# Patient Record
Sex: Female | Born: 1960 | Race: Black or African American | Hispanic: No | Marital: Married | Smoking: Never smoker
Health system: Southern US, Community
[De-identification: ages and names within clinical notes are randomized; demographics above are authoritative.]

## PROBLEM LIST (undated history)

## (undated) DIAGNOSIS — T7840XA Allergy, unspecified, initial encounter: Secondary | ICD-10-CM

## (undated) DIAGNOSIS — G5603 Carpal tunnel syndrome, bilateral upper limbs: Secondary | ICD-10-CM

## (undated) DIAGNOSIS — D573 Sickle-cell trait: Secondary | ICD-10-CM

## (undated) DIAGNOSIS — D571 Sickle-cell disease without crisis: Secondary | ICD-10-CM

## (undated) DIAGNOSIS — N390 Urinary tract infection, site not specified: Secondary | ICD-10-CM

## (undated) DIAGNOSIS — D219 Benign neoplasm of connective and other soft tissue, unspecified: Secondary | ICD-10-CM

## (undated) DIAGNOSIS — J45909 Unspecified asthma, uncomplicated: Secondary | ICD-10-CM

## (undated) DIAGNOSIS — Z8619 Personal history of other infectious and parasitic diseases: Secondary | ICD-10-CM

## (undated) DIAGNOSIS — D069 Carcinoma in situ of cervix, unspecified: Secondary | ICD-10-CM

## (undated) DIAGNOSIS — R102 Pelvic and perineal pain: Secondary | ICD-10-CM

## (undated) DIAGNOSIS — N8300 Follicular cyst of ovary, unspecified side: Secondary | ICD-10-CM

## (undated) HISTORY — PX: MYOMECTOMY: SHX85

## (undated) HISTORY — DX: Allergy, unspecified, initial encounter: T78.40XA

## (undated) HISTORY — DX: Follicular cyst of ovary, unspecified side: N83.00

## (undated) HISTORY — DX: Sickle-cell disease without crisis: D57.1

## (undated) HISTORY — DX: Benign neoplasm of connective and other soft tissue, unspecified: D21.9

## (undated) HISTORY — DX: Personal history of other infectious and parasitic diseases: Z86.19

## (undated) HISTORY — DX: Sickle-cell trait: D57.3

## (undated) HISTORY — PX: COLONOSCOPY: SHX174

## (undated) HISTORY — PX: TUBAL LIGATION: SHX77

## (undated) HISTORY — DX: Carcinoma in situ of cervix, unspecified: D06.9

## (undated) HISTORY — DX: Pelvic and perineal pain: R10.2

## (undated) HISTORY — DX: Unspecified asthma, uncomplicated: J45.909

## (undated) HISTORY — DX: Carpal tunnel syndrome, bilateral upper limbs: G56.03

## (undated) HISTORY — DX: Urinary tract infection, site not specified: N39.0

---

## 1996-09-07 HISTORY — PX: ABDOMINAL HYSTERECTOMY: SHX81

## 1999-09-08 HISTORY — PX: CARPAL TUNNEL RELEASE: SHX101

## 2006-01-11 ENCOUNTER — Other Ambulatory Visit: Admission: RE | Admit: 2006-01-11 | Discharge: 2006-01-11 | Payer: Self-pay | Admitting: Obstetrics and Gynecology

## 2006-06-21 ENCOUNTER — Emergency Department (HOSPITAL_COMMUNITY): Admission: EM | Admit: 2006-06-21 | Discharge: 2006-06-21 | Payer: Self-pay | Admitting: Emergency Medicine

## 2006-11-21 ENCOUNTER — Emergency Department (HOSPITAL_COMMUNITY): Admission: EM | Admit: 2006-11-21 | Discharge: 2006-11-22 | Payer: Self-pay | Admitting: *Deleted
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
## 2007-10-16 IMAGING — CT CT ABDOMEN W/O CM
1 of 2 series · 15 of 32 positions shown, 19 images · IV contrast (agent unspecified)
Comparison: None.

CLINICAL DATA: 45-year-old with hematuria.
 ABDOMEN CT WITHOUT CONTRAST:
TECHNIQUE: Multidetector CT imaging of the abdomen was performed following the standard protocol without IV contrast.
TECHNIQUE: Multidetector CT imaging of the pelvis was performed following the standard protocol without IV contrast.

[Series 2: abd/pelv w/o 5.0 b31f st · axial · non-contrast · 0.67mm/px · z∈[-395,-30]mm · 15 of 81 slices shown, 19 images]
[im 4/81  soft-tissue]
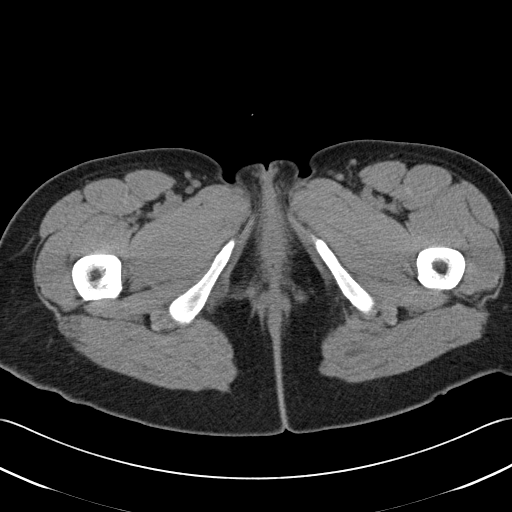
[im 4/81  bone]
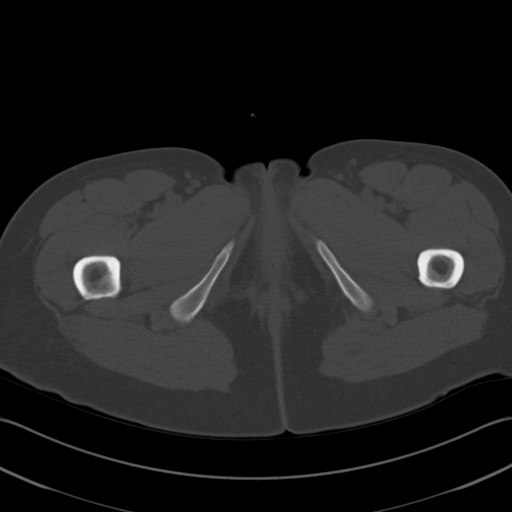
[im 10/81  soft-tissue]
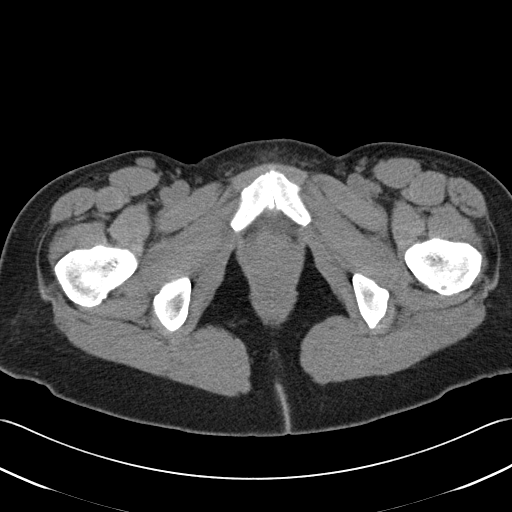
[im 17/81  soft-tissue]
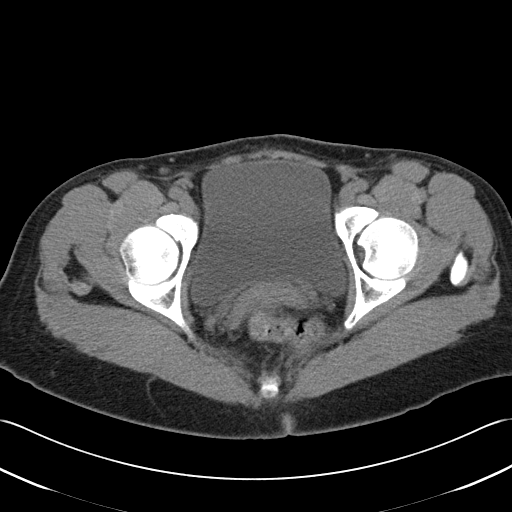
[im 23/81  soft-tissue]
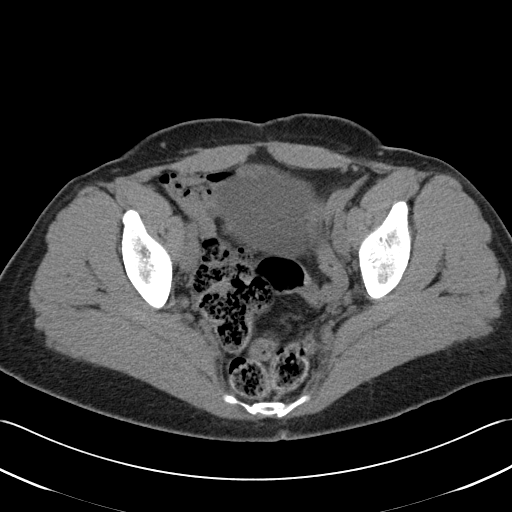
[im 29/81  soft-tissue]
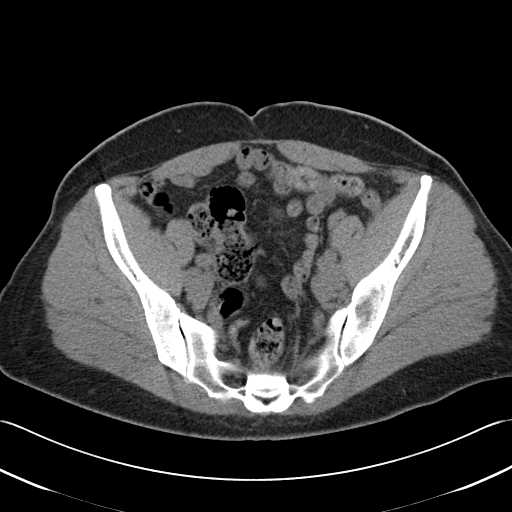
[im 36/81  soft-tissue]
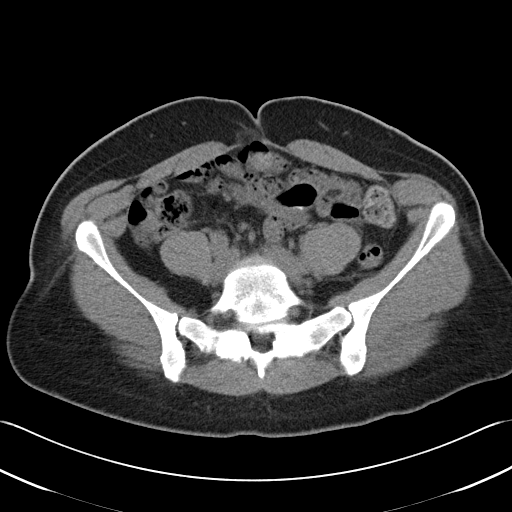
[im 42/81  soft-tissue]
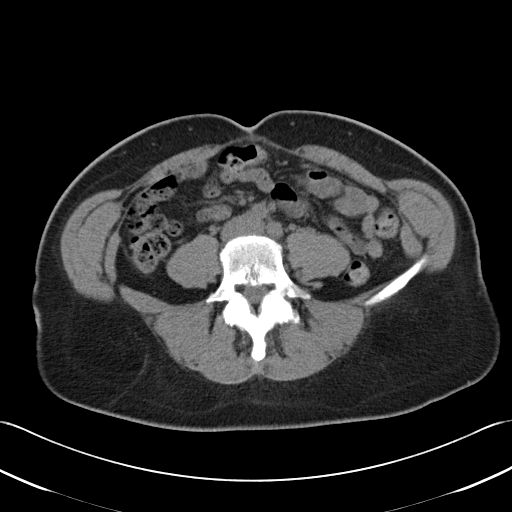
[im 45/81  soft-tissue]
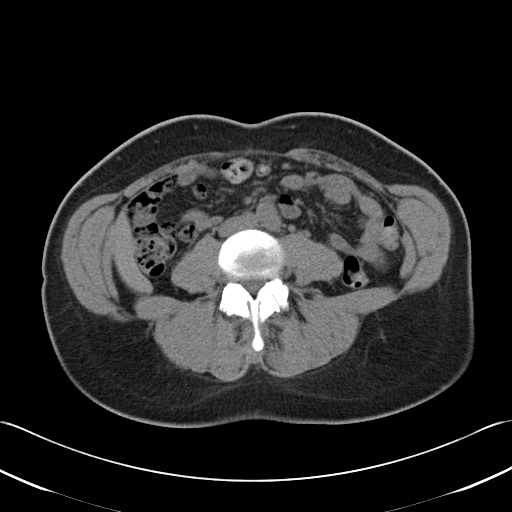
[im 52/81  soft-tissue]
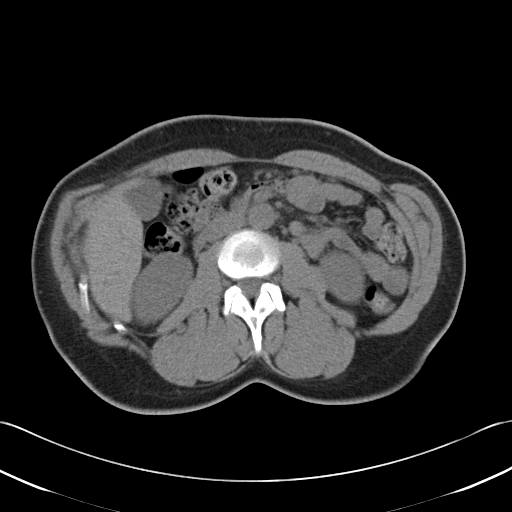
[im 52/81  bone]
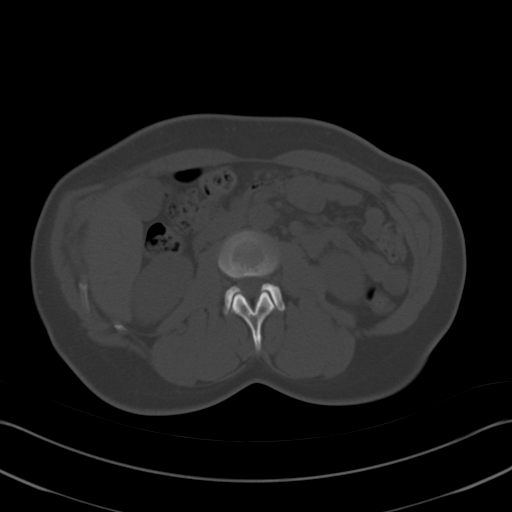
[im 58/81  soft-tissue]
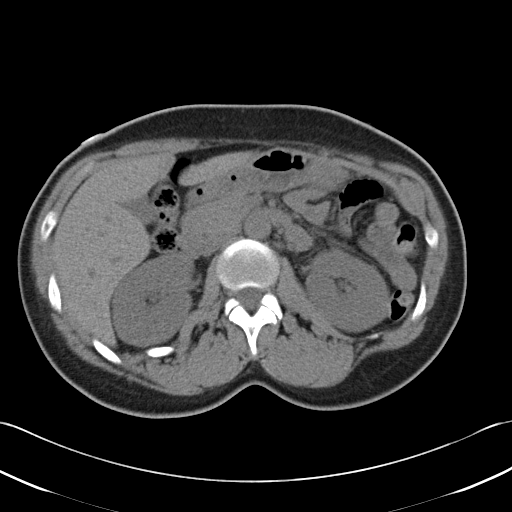
[im 65/81  soft-tissue]
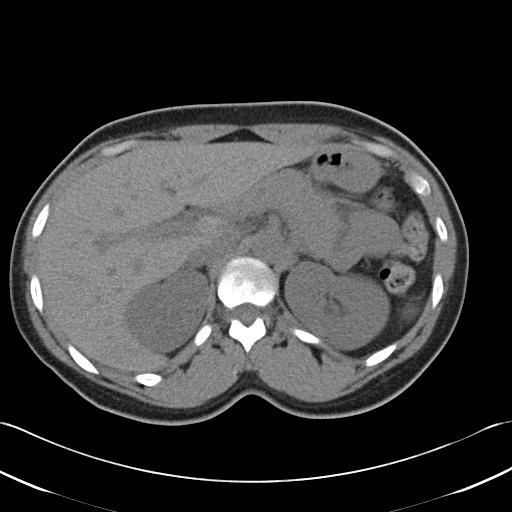
[im 68/81  lung]
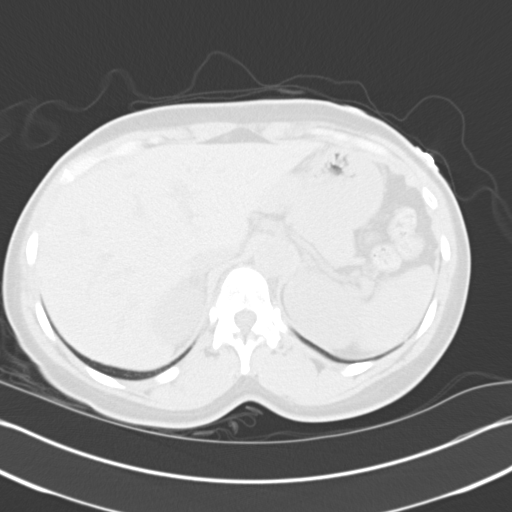
[im 71/81  soft-tissue]
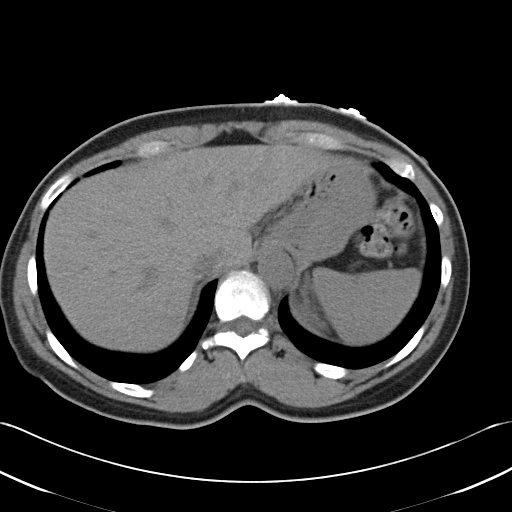
[im 71/81  lung]
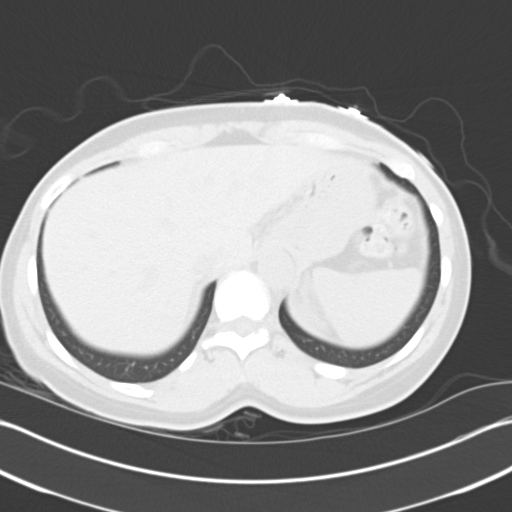
[im 74/81  lung]
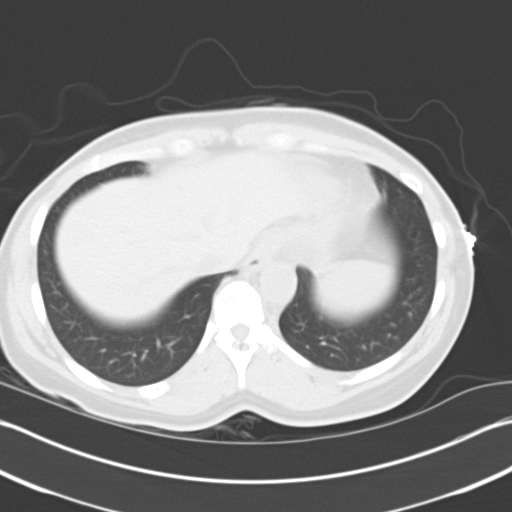
[im 77/81  soft-tissue]
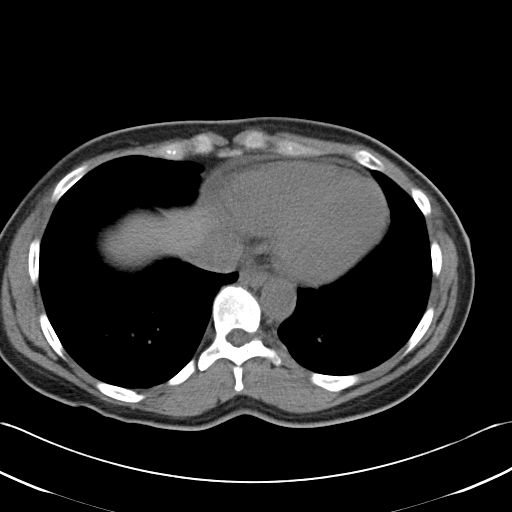
[im 77/81  lung]
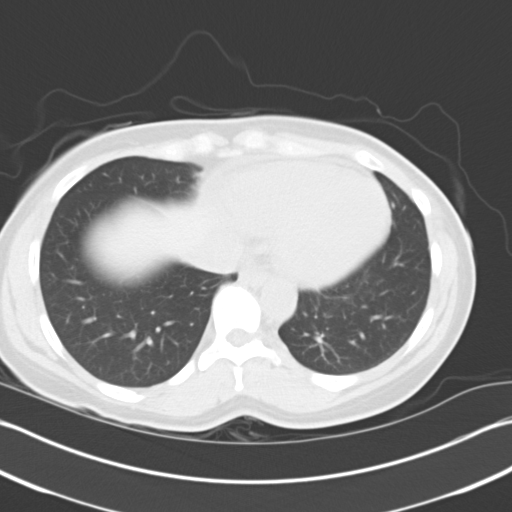

[15 of 32 positions shown; findings below may reference images not displayed]

FINDINGS: The lung bases are clear.  A small amount of pericardial fluid is noted.  The distal esophagus is grossly normal.
 The unenhanced appearance of the liver and spleen are unremarkable.   The spleen is normal in size.  The pancreas, adrenal glands, and kidneys demonstrate no significant findings.  No renal calculi.   No evidence for renal obstructive process. 
 The gallbladder appears normal.   The stomach, duodenum, small bowel, and colon are grossly normal, but the study is limited without oral contrast. 
 The aorta is normal in caliber.   No mesenteric or retroperitoneal masses or adenopathy.   
 No significant bony findings.
IMPRESSION: Unremarkable CT examination of the abdomen. 
 PELVIS CT WITHOUT CONTRAST:
FINDINGS: The rectum, sigmoid colon, and visualized small bowel loops are grossly normal.   No ureteral or bladder calculi.   Phleboliths or calcifications are noted in the pelvis.  The patient has had a hysterectomy.   The ovaries are still present and appear normal.   Mild degenerative changes involving the pubic symphysis.  Facet disease is noted in the lower lumbar spine.
IMPRESSION: 1.  No acute pelvic findings, pelvic masses, or adenopathy. 
 2.  No ureteral or bladder calculi.
 3.  Status post hysterectomy.   Both ovaries are still present and appear normal.

## 2009-11-20 DIAGNOSIS — G5600 Carpal tunnel syndrome, unspecified upper limb: Secondary | ICD-10-CM | POA: Insufficient documentation

## 2011-10-24 DIAGNOSIS — Z9889 Other specified postprocedural states: Secondary | ICD-10-CM

## 2011-10-24 DIAGNOSIS — K635 Polyp of colon: Secondary | ICD-10-CM | POA: Insufficient documentation

## 2011-10-24 DIAGNOSIS — Z90710 Acquired absence of both cervix and uterus: Secondary | ICD-10-CM

## 2011-10-24 DIAGNOSIS — D219 Benign neoplasm of connective and other soft tissue, unspecified: Secondary | ICD-10-CM

## 2011-10-24 DIAGNOSIS — G5600 Carpal tunnel syndrome, unspecified upper limb: Secondary | ICD-10-CM

## 2011-10-24 DIAGNOSIS — R102 Pelvic and perineal pain: Secondary | ICD-10-CM

## 2011-10-24 DIAGNOSIS — N390 Urinary tract infection, site not specified: Secondary | ICD-10-CM

## 2011-10-24 DIAGNOSIS — D069 Carcinoma in situ of cervix, unspecified: Secondary | ICD-10-CM

## 2011-12-09 ENCOUNTER — Ambulatory Visit (INDEPENDENT_AMBULATORY_CARE_PROVIDER_SITE_OTHER): Payer: PRIVATE HEALTH INSURANCE | Admitting: Obstetrics and Gynecology

## 2011-12-09 DIAGNOSIS — Z01419 Encounter for gynecological examination (general) (routine) without abnormal findings: Secondary | ICD-10-CM

## 2012-04-21 ENCOUNTER — Ambulatory Visit (INDEPENDENT_AMBULATORY_CARE_PROVIDER_SITE_OTHER): Payer: PRIVATE HEALTH INSURANCE | Admitting: Family Medicine

## 2012-04-21 VITALS — BP 119/82 | HR 88 | Temp 98.2°F | Resp 18 | Ht 64.0 in | Wt 172.0 lb

## 2012-04-21 DIAGNOSIS — M549 Dorsalgia, unspecified: Secondary | ICD-10-CM

## 2012-04-21 DIAGNOSIS — T148XXA Other injury of unspecified body region, initial encounter: Secondary | ICD-10-CM

## 2012-04-21 MED ORDER — LIDOCAINE 5 % EX PTCH: 1.0000 | MEDICATED_PATCH | CUTANEOUS | Status: AC

## 2012-04-21 NOTE — Progress Notes (Signed)
[facility]:  Office Visit  Chief Complaint:  Chief Complaint  Patient presents with  . Neck Pain    right side since lastnight  . Headache    right side frontal   . Shoulder Pain    right shoulder  . Back Pain    upper back    HPI: Jenna Cox is a 51 y.o. female who complains of  Right sided neck pain, associated with HA behind her right orbit, radiating to shoulder and back of scapula; throbbing, intermittent. Has not taken anything for it. No weakness, numbness or tingling. 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 She has a h/o of carpal tunnels/p repair with residual deficits in the right hand and wrist. She now has carpal tunnel she thinks in the left hand as well.  Denies any new weakness, numbness or tingling.  Gaylyn Rong is not asscoicated with vision changes, photo or phonophobia, neck stiffness ; denies meningeal signs/ sxs   Past Medical History  Diagnosis Date  . Carpal tunnel syndrome of left wrist   . Severe dysplasia of cervix   . Pelvic pain   . Follicle cyst   . Recurrent UTI (urinary tract infection)   . Asthma   . Fibroids   . History of chicken pox   . H/O mumps   . H/O measles   . Sickle cell trait    Past Surgical History  Procedure Date  . Abdominal hysterectomy 1998  . Cesarean section   . Myomectomy   . Tubal ligation   . Carpal tunnel release 2001   History   Social History  . Marital Status: Married    Spouse Name: N/A    Number of Children: N/A  . Years of Education: N/A   Social History Main Topics  . Smoking status: Never Smoker   . Smokeless tobacco: None  . Alcohol Use: No  . Drug Use: None  . Sexually Active: None   Other Topics Concern  . None   Social History Narrative  . None   Family History  Problem Relation Age of Onset  . Cancer Paternal Aunt    Allergies  Allergen Reactions  . Other   . Sulfa Antibiotics    Prior to Admission  medications   Medication Sig Start Date End Date Taking? Authorizing Provider  doxycycline (VIBRAMYCIN) 50 MG capsule Take 50 mg by mouth 2 (two) times daily.   Yes Historical Provider, MD  HYDROcodone-acetaminophen (NORCO) 7.5-325 MG per tablet Take 1 tablet by mouth every 6 (six) hours as needed.   Yes Historical Provider, MD  Multiple Vitamin (MULTIVITAMIN) tablet Take 1 tablet by mouth daily.   Yes Historical Provider, MD     ROS: The patient denies fevers, chills, night sweats, unintentional weight loss, chest pain, palpitations, wheezing, dyspnea on exertion, nausea, vomiting, abdominal pain, dysuria, hematuria, melena, + right wrsit and hand, chronic issues--numbness, weakness, or tingling.   All other systems have been reviewed and were otherwise negative with the exception of those mentioned in the HPI and as above.    PHYSICAL EXAM: Filed Vitals:   04/21/12 1437  BP: 119/82  Pulse: 88  Temp: 98.2 F (36.8 C)  Resp: 18   Filed Vitals:   04/21/12 1437  Height: 5\' 4"  (1.626 m)  Weight: 172 lb (78.019 kg)   Body mass index is 29.52 kg/(m^2).  General: Alert, no acute distress HEENT:  Normocephalic, atraumatic, oropharynx patent.  Cardiovascular:  Regular rate and rhythm, no rubs murmurs or gallops.  No Carotid bruits, radial pulse intact. No pedal edema.  Respiratory: Clear to auscultation bilaterally.  No wheezes, rales, or rhonchi.  No cyanosis, no use of accessory musculature GI: No organomegaly, abdomen is soft and non-tender, positive bowel sounds.  No masses. Skin: No rashes. Neurologic: Facial musculature symmetric. Psychiatric: Patient is appropriate throughout our interaction. Lymphatic: No cervical lymphadenopathy Musculoskeletal: Gait intact. ROM intact Tender at trapezius and rhomoboid bilaterally with palpation 2/2 DTR UE bilaterally Right hand 4/5 strength which is chronic since carpal tunnel surgery Sensation intact    LABS: No results found for this  or any previous visit.   EKG/XRAY:   Primary read interpreted by Dr. Conley Rolls at [facility].   ASSESSMENT/PLAN: Encounter Diagnoses  Name Primary?  . Sprain and strain Yes  . Back pain    Upper back strain secondary to poor posture and hunched position and exacerbation of acute on chronic issues with carpal tunnel due to repetitive motion with computer work Defer Xray  Defer Trigger point injection Would like to try Lidoderm patch and Tylenol prn ( has allergies to ASA/NSAID)    Raiana Pharris PHUONG, DO 04/21/2012 4:19 PM

## 2012-10-08 ENCOUNTER — Ambulatory Visit (INDEPENDENT_AMBULATORY_CARE_PROVIDER_SITE_OTHER): Payer: PRIVATE HEALTH INSURANCE | Admitting: Family Medicine

## 2012-10-08 VITALS — BP 155/84 | HR 83 | Temp 98.4°F | Resp 17 | Ht 64.5 in | Wt 173.0 lb

## 2012-10-08 DIAGNOSIS — Z13220 Encounter for screening for lipoid disorders: Secondary | ICD-10-CM

## 2012-10-08 DIAGNOSIS — R5383 Other fatigue: Secondary | ICD-10-CM

## 2012-10-08 DIAGNOSIS — Z8613 Personal history of malaria: Secondary | ICD-10-CM

## 2012-10-08 DIAGNOSIS — Z0000 Encounter for general adult medical examination without abnormal findings: Secondary | ICD-10-CM

## 2012-10-08 DIAGNOSIS — E559 Vitamin D deficiency, unspecified: Secondary | ICD-10-CM

## 2012-10-08 DIAGNOSIS — R5381 Other malaise: Secondary | ICD-10-CM

## 2012-10-08 LAB — POCT CBC
Granulocyte percent: 64 %G (ref 37–80)
HCT, POC: 38.8 % (ref 37.7–47.9)
Hemoglobin: 12 g/dL — AB (ref 12.2–16.2)
Lymph, poc: 2.4 (ref 0.6–3.4)
MCH, POC: 27.4 pg (ref 27–31.2)
MCHC: 30.9 g/dL — AB (ref 31.8–35.4)
MCV: 88.6 fL (ref 80–97)
MID (cbc): 0.4 (ref 0–0.9)
MPV: 10.1 fL (ref 0–99.8)
POC Granulocyte: 5 (ref 2–6.9)
POC LYMPH PERCENT: 30.6 %L (ref 10–50)
POC MID %: 5.4 % (ref 0–12)
Platelet Count, POC: 295 10*3/uL (ref 142–424)
RBC: 4.38 M/uL (ref 4.04–5.48)
RDW, POC: 14.8 %
WBC: 7.8 10*3/uL (ref 4.6–10.2)

## 2012-10-08 LAB — LIPID PANEL: Triglycerides: 62 mg/dL (ref 40–160)

## 2012-10-08 LAB — BASIC METABOLIC PANEL: Creatinine: 0.8 mg/dL (ref 0.5–1.1)

## 2012-10-08 LAB — TSH: TSH: 0.49 u[IU]/mL (ref 0.41–5.90)

## 2012-10-08 MED ORDER — MEFLOQUINE HCL 250 MG PO TABS: 250.0000 mg | ORAL_TABLET | ORAL | Status: DC

## 2012-10-08 NOTE — Progress Notes (Signed)
[facility]:  Office Visit  Chief Complaint:  Chief Complaint  Patient presents with  . Annual Exam    HPI: Jenna Cox is a 52 y.o. female who is here for  Annual exam without pap. Last pap was at [facility] in [place] in July 2013. Normal pap. Had abnormal paps in past, she also had a partial hysterectomy. Normal pap since hysterectomy in 1999. Getting OB[facility]GY[facility] care at [facility] and also here in [place]. Mammograms have been normal and are UTD, next one in July. She has a colonoscopy in 2012 in [place]-had polyps but does not know if she needs to return  in 5 years /10 years.  Does not want flu vaccine. UTD on TDap.   No complaints except her numbness/tingling from her carpal tunnel and neck issues , has a h/o c-5 disc herniation, carpal tunnel s/p carpal tunnel release. 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 She also has had more numbness and tingling because also has been driving her husband's shift more since she let her son borrow her car.   She would also like malaria ppx due to going to [place] next week for [employer].   Past Medical History  Diagnosis Date  . Carpal tunnel syndrome of left wrist   . Severe dysplasia of cervix   . Pelvic pain   . Follicle cyst   . Recurrent UTI (urinary tract infection)   . Asthma   . Fibroids   . History of chicken pox   . H/O mumps   . H/O measles   . Sickle cell trait    Past Surgical History  Procedure Date  . Abdominal hysterectomy 1998  . Cesarean section   . Myomectomy   . Tubal ligation   . Carpal tunnel release 2001   History   Social History  . Marital Status: Married    Spouse Name: N/A    Number of Children: N/A  . Years of Education: N/A   Social History Main Topics  . Smoking status: Never Smoker   . Smokeless tobacco:  None  . Alcohol Use: No  . Drug Use: No  . Sexually Active: Yes    Birth Control/ Protection: None   Other Topics Concern  . None   Social History Narrative  . None   Family History  Problem Relation Age of Onset  . Cancer Paternal Aunt    Allergies  Allergen Reactions  . Other   . Sulfa Antibiotics    Prior to Admission medications   Medication Sig Start Date End Date Taking? Authorizing Provider  Multiple Vitamin (MULTIVITAMIN) tablet Take 1 tablet by mouth daily.   Yes Historical Provider, MD  doxycycline (VIBRAMYCIN) 50 MG capsule Take 50 mg by mouth 2 (two) times daily.    Historical Provider, MD  HYDROcodone-acetaminophen (NORCO) 7.5-325 MG per tablet Take 1 tablet by mouth every 6 (six) hours as needed.    Historical Provider, MD     ROS: The patient denies fevers, chills, night sweats, unintentional weight loss, chest pain, palpitations, wheezing, dyspnea on exertion, nausea, vomiting, abdominal pain, dysuria, hematuria, melena All other systems have been reviewed and were otherwise negative with the exception of those mentioned in the HPI and as above.    PHYSICAL EXAM: Filed Vitals:   10/08/12  1142  BP: 155/84  Pulse: 83  Temp: 98.4 F (36.9 C)  Resp: 17   Filed Vitals:   10/08/12 1142  Height: 5' 4.5" (1.638 m)  Weight: 173 lb (78.472 kg)   Body mass index is 29.24 kg/(m^2).  General: Alert, no acute distress HEENT:  Normocephalic, atraumatic, oropharynx patent. EOMI, PERRLA, fundoscopic exam nl Cardiovascular:  Regular rate and rhythm, no rubs murmurs or gallops.  No Carotid bruits, radial pulse intact. No pedal edema.  Respiratory: Clear to auscultation bilaterally.  No wheezes, rales, or rhonchi.  No cyanosis, no use of accessory musculature GI: No organomegaly, abdomen is soft and non-tender, positive bowel sounds.  No masses. Skin: No rashes. Neurologic: Facial musculature symmetric. Psychiatric: Patient is appropriate throughout our  interaction. Lymphatic: No cervical lymphadenopathy Musculoskeletal: Gait intact.   LABS: Results for orders placed in visit on 10/08/12  POCT CBC      Component Value Range   WBC 7.8  4.6 - 10.2 K/uL   Lymph, poc 2.4  0.6 - 3.4   POC LYMPH PERCENT 30.6  10 - 50 %L   MID (cbc) 0.4  0 - 0.9   POC MID % 5.4  0 - 12 %M   POC Granulocyte 5.0  2 - 6.9   Granulocyte percent 64.0  37 - 80 %G   RBC 4.38  4.04 - 5.48 M/uL   Hemoglobin 12.0 (*) 12.2 - 16.2 g/dL   HCT, POC 16.1  09.6 - 47.9 %   MCV 88.6  80 - 97 fL   MCH, POC 27.4  27 - 31.2 pg   MCHC 30.9 (*) 31.8 - 35.4 g/dL   RDW, POC 04.5     Platelet Count, POC 295  142 - 424 K/uL   MPV 10.1  0 - 99.8 fL     EKG/XRAY:   Primary read interpreted by Dr. Conley Rolls at [facility].   ASSESSMENT/PLAN: Encounter Diagnoses  Name Primary?  . Vitamin D deficiency Yes  . PE (physical exam), annual   . Fatigue   . Screening, lipid   . History of malaria    Labs pending for annual exam Advise to call GI office and see when should return to office for repeat colonoscopy Rx Mefloquine ppx for 1 week visit to [place] for funeral Wrist brace for carpal tunnel to use at night and at work, she already has them F/u prn   Lainie Daubert PHUONG, DO 10/08/2012 2:49 PM

## 2012-10-27 ENCOUNTER — Telehealth: Payer: Self-pay

## 2012-10-27 ENCOUNTER — Other Ambulatory Visit: Payer: Self-pay | Admitting: Family Medicine

## 2012-10-27 DIAGNOSIS — E559 Vitamin D deficiency, unspecified: Secondary | ICD-10-CM

## 2012-10-27 MED ORDER — ERGOCALCIFEROL 1.25 MG (50000 UT) PO CAPS: 50000.0000 [IU] | ORAL_CAPSULE | ORAL | Status: DC

## 2012-10-27 NOTE — Telephone Encounter (Signed)
Can you check on this?

## 2012-10-27 NOTE — Telephone Encounter (Signed)
Patient calling in regards to lab results that she had done on 10/08/12 with dr. Conley Rolls when she had her cpe. Labs appear canceled? Can someone look into this and let her know if she needs this redone or if the labs are ready with results. Mirian Mo you! Best number is: [phone]

## 2012-10-27 NOTE — Telephone Encounter (Signed)
Pt's labs were sent to [facility]. Gave the copy to Dr. Conley Rolls for review. Per Dr. Conley Rolls, let pt know that her Vit D is still low at 22 and that we will send in Vit D 50,000 for 12 weeks then she is to RTC for a recheck. Also notified her that her LDL was 130 and to work on diet and exercise. All of the rest of her labs were normal. Labs sent to MR to be scanned.

## 2012-10-29 ENCOUNTER — Encounter: Payer: Self-pay | Admitting: Family Medicine

## 2012-11-26 ENCOUNTER — Encounter: Payer: Self-pay | Admitting: *Deleted

## 2012-12-29 ENCOUNTER — Ambulatory Visit (INDEPENDENT_AMBULATORY_CARE_PROVIDER_SITE_OTHER): Payer: PRIVATE HEALTH INSURANCE | Admitting: Physician Assistant

## 2012-12-29 ENCOUNTER — Encounter: Payer: Self-pay | Admitting: Physician Assistant

## 2012-12-29 VITALS — BP 131/76 | HR 89 | Temp 98.5°F | Resp 16 | Ht 64.0 in | Wt 173.0 lb

## 2012-12-29 DIAGNOSIS — J069 Acute upper respiratory infection, unspecified: Secondary | ICD-10-CM

## 2012-12-29 DIAGNOSIS — J029 Acute pharyngitis, unspecified: Secondary | ICD-10-CM

## 2012-12-29 DIAGNOSIS — R509 Fever, unspecified: Secondary | ICD-10-CM

## 2012-12-29 LAB — POCT INFLUENZA A/B: Influenza B, POC: NEGATIVE

## 2012-12-29 LAB — POCT RAPID STREP A (OFFICE): Rapid Strep A Screen: NEGATIVE

## 2012-12-29 MED ORDER — ALBUTEROL SULFATE (2.5 MG/3ML) 0.083% IN NEBU
2.5000 mg | INHALATION_SOLUTION | Freq: Once | RESPIRATORY_TRACT | Status: AC
  Administered 2012-12-29: 2.5 mg via RESPIRATORY_TRACT

## 2012-12-29 MED ORDER — PREDNISONE 20 MG PO TABS: ORAL_TABLET | ORAL | Status: DC

## 2012-12-29 MED ORDER — HYDROCODONE-HOMATROPINE 5-1.5 MG/5ML PO SYRP: ORAL_SOLUTION | ORAL | Status: DC

## 2012-12-29 MED ORDER — ALBUTEROL SULFATE HFA 108 (90 BASE) MCG/ACT IN AERS: 2.0000 | INHALATION_SPRAY | RESPIRATORY_TRACT | Status: DC | PRN

## 2012-12-29 MED ORDER — IPRATROPIUM BROMIDE 0.06 % NA SOLN: 2.0000 | Freq: Three times a day (TID) | NASAL | Status: DC

## 2012-12-29 MED ORDER — AZITHROMYCIN 250 MG PO TABS: ORAL_TABLET | ORAL | Status: DC

## 2012-12-29 NOTE — Progress Notes (Signed)
Patient ID: Surie Suchocki MRN: [id number], DOB: 1960-10-17, 52 y.o. Date of Encounter: 12/29/2012, 11:12 AM  Primary Physician: Dois Davenport., MD  Chief Complaint: URI symptoms x 2 days  HPI: 52 y.o. female with history below presents with a 2 day history of fever, chills, myalgias, nasal congestion, sore throat, sinus pressure, headache, ear fullness, post nasal drip, and cough. Uncertain of T max. Cough is productive secondary to post nasal drip. Does note some chest tightness associated with her asthma. Asthma is well controlled at baseline. No wheezing. Nasal congestion is thick and yellow. It has been mixed with some blood. Never a smoker. She has not yet taken any medication to help with her above symptoms. She did not receive a flu vaccine this year.    Past Medical History  Diagnosis Date  . Carpal tunnel syndrome of left wrist   . Severe dysplasia of cervix   . Pelvic pain   . Follicle cyst   . Recurrent UTI (urinary tract infection)   . Asthma   . Fibroids   . History of chicken pox   . H/O mumps   . H/O measles   . Sickle cell trait      Home Meds: Prior to Admission medications   Medication Sig Start Date End Date Taking? Authorizing Provider  doxycycline (VIBRAMYCIN) 50 MG capsule Take 50 mg by mouth 2 (two) times daily.   No Historical Provider, MD  ergocalciferol (VITAMIN D2) 50000 UNITS capsule Take 1 capsule (50,000 Units total) by mouth once a week. 10/27/12  No Thao P Le, DO  HYDROcodone-acetaminophen (NORCO) 7.5-325 MG per tablet Take 1 tablet by mouth every 6 (six) hours as needed.   No Historical Provider, MD  mefloquine (LARIAM) 250 MG tablet Take 1 tablet (250 mg total) by mouth every 7 (seven) days. 10/08/12  No Thao P Le, DO  Multiple Vitamin (MULTIVITAMIN) tablet Take 1 tablet by mouth daily.   No Historical Provider, MD    Allergies:  Allergies  Allergen Reactions  . Other   . Sulfa Antibiotics     History   Social History  . Marital  Status: Married    Spouse Name: N/A    Number of Children: N/A  . Years of Education: N/A   Occupational History  . Not on file.   Social History Main Topics  . Smoking status: Never Smoker   . Smokeless tobacco: Not on file  . Alcohol Use: No  . Drug Use: No  . Sexually Active: Yes    Birth Control/ Protection: None   Other Topics Concern  . Not on file   Social History Narrative  . No narrative on file     Review of Systems: Constitutional: positive for fever, chills, myalgias, and fatigue. negative for night sweats, or weight changes HEENT: see above Cardiovascular: negative for chest pain or palpitations Respiratory: positive for cough. negative for hemoptysis, wheezing, dyspnea, or shortness of breath Abdominal: negative for abdominal pain, nausea, vomiting, or diarrhea Dermatological: negative for rash Neurologic: positive for headache. negative for dizziness, or syncope   Physical Exam: Blood pressure 131/76, pulse 89, temperature 98.5 F (36.9 C), temperature source Oral, resp. rate 16, height 5\' 4"  (1.626 m), weight 173 lb (78.472 kg), SpO2 96.00%., Body mass index is 29.68 kg/(m^2). General: Well developed, well nourished, in no acute distress. Head: Normocephalic, atraumatic, eyes without discharge, sclera non-icteric, nares are without discharge. Bilateral auditory canals clear, TM's are without perforation, pearly grey and translucent with reflective  cone of light bilaterally. Oral cavity moist, posterior pharynx without exudate, erythema, peritonsillar abscess, or post nasal drip.  Neck: Supple. No thyromegaly. Full ROM. No lymphadenopathy. Lungs: Mild expiratory wheezing without rales or rhonchi. Breathing is unlabored. Status post albuterol nebulizer: Patient initially complained of tremors after neb, this resolved after several minutes. At discharge patient felt somewhat better. Decreased wheezing to auscultation.  Heart: RRR with S1 S2. No murmurs, rubs, or  gallops appreciated. Msk:  Strength and tone normal for age. Extremities/Skin: Warm and dry. No clubbing or cyanosis. No edema. No rashes or suspicious lesions. Neuro: Alert and oriented X 3. Moves all extremities spontaneously. Gait is normal. CNII-XII grossly in tact. Psych:  Responds to questions appropriately with a normal affect.   Labs: Results for orders placed in visit on 12/29/12  POCT INFLUENZA A/B      Result Value Range   Influenza A, POC Negative     Influenza B, POC Negative    POCT RAPID STREP A (OFFICE)      Result Value Range   Rapid Strep A Screen Negative  Negative     ASSESSMENT AND PLAN:  52 y.o. female with URI and history of asthma -Albuterol nebulizer in office -Azithromycin 250 MG #6 2 po first day then 1 po next 4 days no RF -Prednisone 20 mg #18 3x3, 2x3, 1x3 no RF -Proventil 2 puffs inhaled q 4-6 hours prn #1 RF 2 -Atrovent NS 0.06% 2 sprays each nare bid prn #1 no RF -Mucinex -Rest/fluids -RTC precautions   Signed, Eula Listen, PA-C 12/29/2012 11:12 AM

## 2012-12-31 LAB — CULTURE, GROUP A STREP: Organism ID, Bacteria: NORMAL

## 2013-01-09 ENCOUNTER — Encounter: Payer: Self-pay | Admitting: Family Medicine

## 2013-10-12 ENCOUNTER — Telehealth: Payer: Self-pay

## 2013-10-12 NOTE — Telephone Encounter (Signed)
PT STATES SHE HAD CALLED YESTERDAY REGARDING SOME IMMUNIZATION RECORDS, DIDN'T KNOW WHAT SHE HAD AND WAS TOLD SOMEONE WAS GOING TO CALL HER YESTERDAY AND SHE HASN'T HEARD FROM ANYONE. PLEASE CALL [phone]

## 2013-10-15 ENCOUNTER — Ambulatory Visit (INDEPENDENT_AMBULATORY_CARE_PROVIDER_SITE_OTHER): Payer: PRIVATE HEALTH INSURANCE | Admitting: Physician Assistant

## 2013-10-15 VITALS — BP 142/72 | HR 97 | Temp 98.2°F | Resp 18 | Ht 64.0 in | Wt 181.0 lb

## 2013-10-15 DIAGNOSIS — Z23 Encounter for immunization: Secondary | ICD-10-CM

## 2013-10-15 DIAGNOSIS — Z7189 Other specified counseling: Secondary | ICD-10-CM

## 2013-10-15 DIAGNOSIS — Z7185 Encounter for immunization safety counseling: Secondary | ICD-10-CM

## 2013-10-15 NOTE — Progress Notes (Signed)
   Subjective:    Patient ID: Jenna Cox, female    DOB: 12-02-1960, 53 y.o.   MRN: [id number]  HPI   Mr. Bonser is a very pleasant 53 yr old female here for immunization review.  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  Had previously been told at enrollment that due to her age and status as grad student she was not required to submit proof of immunizations.  The school notified her this week that she must now provide documentation that she meets the school's requirements.  She has been told her deadline for completing this is 10/18/13.  She requires proof of 3 tetanus vaccines, one of which must be tdap and 2 mmr.  She does have records with her today.     Review of Systems  Constitutional: Negative.   Respiratory: Negative.   Cardiovascular: Negative.   Gastrointestinal: Negative.        Objective:   Physical Exam  Vitals reviewed. Constitutional: She is oriented to person, place, and time. She appears well-developed and well-nourished. No distress.  HENT:  Head: Normocephalic and atraumatic.  Eyes: Conjunctivae are normal. No scleral icterus.  Pulmonary/Chest: Effort normal.  Neurological: She is alert and oriented to person, place, and time.  Skin: Skin is warm and dry.  Psychiatric: She has a normal mood and affect. Her behavior is normal.    Immunization History  Administered Date(s) Administered  . Hepatitis A 07/31/1994, 08/21/2011  . IPV 08/25/1991, 07/04/1997, 04/01/2004  . MMR 07/31/1994  . Td 03/13/1985, 07/31/1994, 03/12/2004  . Tdap 08/21/2011  . Typhoid Live 04/01/2004  . Typhoid Parenteral 08/21/2011  . Yellow Fever 04/01/2004       Assessment & Plan:  Immunization counseling - Plan: Measles/Mumps/Rubella Immunity  Need for MMR vaccine - Plan: Measles/Mumps/Rubella Immunity   Jenna Cox is a very pleasant 53 yr old female here for immunization review.  I have updated all of her historical immunizations in epic. She meets her  requirement for Td and Tdap.  She has one documented MMR. Will draw MMR titer today.  If immune, no further action.  If not immune will administer second dose MMR.  Pt has original imm form with her.  Will bring back to clinic when results are available. I or another PA will sign her completed form.  I have also printed her a letter for school stating that her MMR titer is pending.   Alonza Smoker MHS, PA-C [facility] 2/8/20159:38 PM

## 2013-10-17 ENCOUNTER — Telehealth: Payer: Self-pay | Admitting: Physician Assistant

## 2013-10-17 LAB — MEASLES/MUMPS/RUBELLA IMMUNITY
Mumps IgG: >300 AU/mL — ABNORMAL HIGH (ref <9.00)
RUBELLA: 15.5 {index} — AB (ref <0.90)

## 2013-10-17 NOTE — Telephone Encounter (Signed)
LM on cell regarding +MMR titers.  Pt to bring immunization form in to clinic to be signed

## 2013-10-18 NOTE — Telephone Encounter (Signed)
Form was signed by Natividad Brood and picked up by patient last night. Patient also requested lab titers faxed to her and this has been done with confirmation. Patient called back to say thank you.

## 2013-10-27 ENCOUNTER — Ambulatory Visit (INDEPENDENT_AMBULATORY_CARE_PROVIDER_SITE_OTHER): Payer: PRIVATE HEALTH INSURANCE | Admitting: Family Medicine

## 2013-10-27 ENCOUNTER — Encounter: Payer: Self-pay | Admitting: Family Medicine

## 2013-10-27 VITALS — BP 130/88 | HR 67 | Temp 98.7°F | Resp 16 | Ht 63.5 in | Wt 178.6 lb

## 2013-10-27 DIAGNOSIS — Z0000 Encounter for general adult medical examination without abnormal findings: Secondary | ICD-10-CM

## 2013-10-27 DIAGNOSIS — R5381 Other malaise: Secondary | ICD-10-CM

## 2013-10-27 DIAGNOSIS — R5383 Other fatigue: Secondary | ICD-10-CM

## 2013-10-27 DIAGNOSIS — E559 Vitamin D deficiency, unspecified: Secondary | ICD-10-CM

## 2013-10-27 LAB — POCT UA - MICROSCOPIC ONLY
Bacteria, U Microscopic: NEGATIVE
Casts, Ur, LPF, POC: NEGATIVE
Mucus, UA: NEGATIVE
WBC, Ur, HPF, POC: NEGATIVE
Yeast, UA: NEGATIVE

## 2013-10-27 LAB — POCT URINALYSIS DIPSTICK
Bilirubin, UA: NEGATIVE
Glucose, UA: NEGATIVE
Ketones, UA: NEGATIVE
Leukocytes, UA: NEGATIVE
Nitrite, UA: NEGATIVE
Protein, UA: NEGATIVE
Spec Grav, UA: 1.015
Urobilinogen, UA: 0.2
pH, UA: 5.5

## 2013-10-27 LAB — CBC
HCT: 33.8 % — ABNORMAL LOW (ref 36.0–46.0)
Hemoglobin: 11.6 g/dL — ABNORMAL LOW (ref 12.0–15.0)
MCH: 28.2 pg (ref 26.0–34.0)
MCHC: 34.3 g/dL (ref 30.0–36.0)
MCV: 82.2 fL (ref 78.0–100.0)
Platelets: 280 10*3/uL (ref 150–400)
RBC: 4.11 MIL/uL (ref 3.87–5.11)
RDW: 14.9 % (ref 11.5–15.5)
WBC: 7.6 10*3/uL (ref 4.0–10.5)

## 2013-10-27 LAB — COMPREHENSIVE METABOLIC PANEL
AST: 25 U/L (ref 0–37)
Albumin: 4.5 g/dL (ref 3.5–5.2)
BUN: 15 mg/dL (ref 6–23)
Calcium: 9.4 mg/dL (ref 8.4–10.5)
Chloride: 107 mEq/L (ref 96–112)
Creat: 0.77 mg/dL (ref 0.50–1.10)
Glucose, Bld: 78 mg/dL (ref 70–99)
Potassium: 3.8 mEq/L (ref 3.5–5.3)

## 2013-10-27 LAB — LIPID PANEL
Cholesterol: 184 mg/dL (ref 0–200)
HDL: 42 mg/dL (ref >39)
LDL Cholesterol: 130 mg/dL — ABNORMAL HIGH (ref 0–99)
Total CHOL/HDL Ratio: 4.4 Ratio
Triglycerides: 60 mg/dL (ref <150)
VLDL: 12 mg/dL (ref 0–40)

## 2013-10-27 LAB — COMPREHENSIVE METABOLIC PANEL WITH GFR
ALT: 29 U/L (ref 0–35)
Alkaline Phosphatase: 71 U/L (ref 39–117)
CO2: 28 meq/L (ref 19–32)
Sodium: 144 meq/L (ref 135–145)
Total Bilirubin: 0.4 mg/dL (ref 0.2–1.2)
Total Protein: 7.3 g/dL (ref 6.0–8.3)

## 2013-10-27 LAB — TSH: TSH: 0.675 u[IU]/mL (ref 0.350–4.500)

## 2013-10-27 NOTE — Progress Notes (Signed)
Chief Complaint:  Chief Complaint  Patient presents with  . Annual Exam    NO PAP    HPI: Jenna Cox is a 53 y.o. female who is here for Annual exam without pap. Last pap was at [place] in [place] in July 2014. Normal pap. Had abnormal paps in past, Jenna also had a partial hysterectomy. Normal pap since hysterectomy in 1998. Getting OB/GYN care at [facility] and also here in [place]. Mammograms have been normal and are UTD, last one in July 2014 Jenna has a colonoscopy in 2012 in [place] but does not know if Jenna needs to return in 5 years /10 years. Does not want flu vaccine. UTD on TDap.   No complaints, Jenna has chronic pelvic pain which is related to prior ab surgeries, Jenna denies feves, chills, nausea vomiting.  Jenna also has chronic fatigue due to prior low Vit D and also lack of sleep. 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    Past Medical History  Diagnosis Date  . Carpal tunnel syndrome of left wrist   . Severe dysplasia of cervix   . Pelvic pain   . Follicle cyst   . Recurrent UTI (urinary tract infection)   . Asthma   . Fibroids   . History of chicken pox   . H/O mumps   . H/O measles   . Sickle cell trait   . Allergy    Past Surgical History  Procedure Laterality Date  . Abdominal hysterectomy  1998  . Cesarean section    . Myomectomy    . Tubal ligation    . Carpal tunnel release  2001   History   Social History  . Marital Status: Married    Spouse Name: N/A    Number of Children: N/A  . Years of Education: N/A   Occupational History  . legal assistant    Social History Main Topics  . Smoking status: Never Smoker   . Smokeless tobacco: None  . Alcohol Use: No  . Drug Use: No  . Sexual Activity: Yes    Birth Control/ Protection: None   Other Topics Concern  . None   Social History Narrative  . None   Family History  Problem Relation Age of  Onset  . Cancer Paternal Aunt   . Cancer Father   . Diabetes Father   . Hyperlipidemia Father   . Hypertension Father   . Stroke Father   . Sickle cell trait Brother    Allergies  Allergen Reactions  . Other   . Sulfa Antibiotics    Prior to Admission medications   Medication Sig Start Date End Date Taking? Authorizing Provider  Multiple Vitamin (MULTIVITAMIN) tablet Take 1 tablet by mouth daily.   Yes Historical Provider, MD  albuterol (PROVENTIL HFA;VENTOLIN HFA) 108 (90 BASE) MCG/ACT inhaler Inhale 2 puffs into the lungs every 4 (four) hours as needed for wheezing. 12/29/12   Rise Mu, PA-C  azithromycin (ZITHROMAX Z-PAK) 250 MG tablet 2 tabs po first day, then 1 tab po next 4 days 12/29/12   Rise Mu, PA-C  doxycycline (VIBRAMYCIN) 50 MG capsule Take 50 mg by mouth 2 (two) times daily.    Historical Provider, MD  ergocalciferol (VITAMIN D2) 50000 UNITS capsule Take 1 capsule (50,000 Units total) by mouth once a week. 10/27/12   Sabatino Williard P Inette Doubrava,  DO  HYDROcodone-acetaminophen (NORCO) 7.5-325 MG per tablet Take 1 tablet by mouth every 6 (six) hours as needed.    Historical Provider, MD  HYDROcodone-homatropine [facility]) 5-1.5 MG/5ML syrup 1 TSP PO Q 4-6 HOURS PRN COUGH 12/29/12   Areta Haber Dunn, PA-C  ipratropium (ATROVENT) 0.06 % nasal spray Place 2 sprays into the nose 3 (three) times daily. 12/29/12   Rise Mu, PA-C  mefloquine (LARIAM) 250 MG tablet Take 1 tablet (250 mg total) by mouth every 7 (seven) days. 10/08/12   Takeira Yanes P Atasha Colebank, DO  predniSONE (DELTASONE) 20 MG tablet 3 PO FOR 3 DAYS, 2 PO FOR 3 DAYS, 1 PO FOR 3 DAYS 12/29/12   Rise Mu, PA-C     ROS: The patient denies fevers, chills, night sweats, unintentional weight loss, chest pain, palpitations, wheezing, dyspnea on exertion, nausea, vomiting, acute abdominal pain, dysuria, hematuria, melena, numbness, weakness, or tingling.   All other systems have been reviewed and were otherwise negative with the exception of those mentioned  in the HPI and as above.    PHYSICAL EXAM: Filed Vitals:   10/27/13 1244  BP: 130/88  Pulse: 67  Temp: 98.7 F (37.1 C)  Resp: 16  SPo2   100% Filed Vitals:   10/27/13 1244  Height: 5' 3.5" (1.613 m)  Weight: 178 lb 9.6 oz (81.012 kg)   Body mass index is 31.14 kg/(m^2).  General: Alert, no acute distress HEENT:  Normocephalic, atraumatic, oropharynx patent. EOMI, PERRLA, fundoscopic exam nl Cardiovascular:  Regular rate and rhythm, no rubs murmurs or gallops.  No Carotid bruits, radial pulse intact. No pedal edema.  Respiratory: Clear to auscultation bilaterally.  No wheezes, rales, or rhonchi.  No cyanosis, no use of accessory musculature GI: No organomegaly, abdomen is soft and non-tender, positive bowel sounds.  No masses. Skin: No rashes. Neurologic: Facial musculature symmetric. Psychiatric: Patient is appropriate throughout our interaction. Lymphatic: No cervical lymphadenopathy Musculoskeletal: Gait intact. 5/5 strength, 2/2 DTRs , good msk tone UE and Jenna Cox   LABS: Results for orders placed in visit on 10/15/13  MEASLES/MUMPS/RUBELLA IMMUNITY      Result Value Ref Range   Rubella 15.50 (*) <0.90 Index   Mumps IgG >300.00 (*) <9.00 AU/mL   Rubeola IgG >300.00 (*) <25.00 AU/mL     EKG/XRAY:   Primary read interpreted by Dr. Marin Comment at [facility].   ASSESSMENT/PLAN: Encounter Diagnoses  Name Primary?  . Annual physical exam Yes  . Unspecified vitamin D deficiency   . Other malaise and fatigue    Annual labs: CBC, CMP, lipid, TSH Jenna gets mammograms, breast exams, and pelvic pap exams at [place] for fatigue/vit d def : Vit D 25 OH and TSH F/u in 1 yr or prn   Gross sideeffects, risk and benefits, and alternatives of medications d/w patient. Patient is aware that all medications have potential sideeffects and we are unable to predict every sideeffect or drug-drug interaction that may occur.  Brittanni Cariker, [place], DO 10/27/2013 1:40 PM

## 2013-10-28 LAB — VITAMIN D 25 HYDROXY (VIT D DEFICIENCY, FRACTURES): Vit D, 25-Hydroxy: 42 ng/mL (ref 30–89)

## 2013-11-01 ENCOUNTER — Encounter: Payer: Self-pay | Admitting: Family Medicine

## 2016-02-19 ENCOUNTER — Ambulatory Visit (INDEPENDENT_AMBULATORY_CARE_PROVIDER_SITE_OTHER): Payer: PRIVATE HEALTH INSURANCE | Admitting: Physician Assistant

## 2016-02-19 VITALS — BP 140/80 | HR 77 | Temp 98.1°F | Resp 18 | Ht 64.5 in | Wt 164.2 lb

## 2016-02-19 DIAGNOSIS — Z23 Encounter for immunization: Secondary | ICD-10-CM

## 2016-02-19 DIAGNOSIS — Z7189 Other specified counseling: Secondary | ICD-10-CM | POA: Diagnosis not present

## 2016-02-19 DIAGNOSIS — IMO0002 Reserved for concepts with insufficient information to code with codable children: Secondary | ICD-10-CM

## 2016-02-19 MED ORDER — DOXYCYCLINE HYCLATE 100 MG PO CAPS: 100.0000 mg | ORAL_CAPSULE | Freq: Every day | ORAL | Status: AC

## 2016-02-19 MED ORDER — TYPHOID VACCINE PO CPDR: 1.0000 | DELAYED_RELEASE_CAPSULE | ORAL | Status: DC

## 2016-02-19 NOTE — Patient Instructions (Addendum)
     IF you received an x-ray today, you will receive an invoice from [facility]. Please contact [facility] at [phone] with questions or concerns regarding your invoice.   IF you received labwork today, you will receive an invoice from [employer]. Please contact [facility] at [phone] with questions or concerns regarding your invoice.   Our billing staff will not be able to assist you with questions regarding bills from these companies.  You will be contacted with the lab results as soon as they are available. The fastest way to get your results is to activate your My Chart account. Instructions are located on the last page of this paperwork. If you have not heard from [place] regarding the results in 2 weeks, please contact this office.    Take the doxycycline 2 days prior to travel, during your stay, and 4 weeks following the trip. You will need to have the typhoid vaccine 1 week prior to your travel.

## 2016-03-01 NOTE — Progress Notes (Signed)
[facility] [address] [place] [address] [phone]  Date:  02/19/2016   Name:  Jenna Cox   DOB:  1960/10/27   MRN:  [id number]  PCP:  Hayden Rasmussen., MD    History of Present Illness:  Jenna Cox is a 55 y.o. female patient who presents to [facility] for immunizations.   She will be going to the [facility] in about 2 weeks.  She will stay for 3 weeks.   She has had no recent illness     Patient Active Problem List   Diagnosis Date Noted  . Colon polyps 10/24/2011  . Carpal tunnel syndrome 11/20/2009  . Urinary tract infection, recurrent 01/25/2006  . Follicular cyst 0000000  . Severe dysplasia of cervix 01/11/2006  . Fibroids 01/11/2006  . S/P LEEP of cervix 01/11/2006  . Pelvic pain 01/11/2006  . H/O hysterectomy for benign disease 01/11/2006    Past Medical History  Diagnosis Date  . Carpal tunnel syndrome of left wrist   . Severe dysplasia of cervix   . Pelvic pain   . Follicle cyst   . Recurrent UTI (urinary tract infection)   . Asthma   . Fibroids   . History of chicken pox   . H/O mumps   . H/O measles   . Sickle cell trait ([place])   . Allergy     Past Surgical History  Procedure Laterality Date  . Abdominal hysterectomy  1998  . Cesarean section    . Myomectomy    . Tubal ligation    . Carpal tunnel release  2001    Social History  Substance Use Topics  . Smoking status: Never Smoker   . Smokeless tobacco: None  . Alcohol Use: No    Family History  Problem Relation Age of Onset  . Cancer Paternal Aunt   . Cancer Father   . Diabetes Father   . Hyperlipidemia Father   . Hypertension Father   . Stroke Father   . Sickle cell trait Brother     Allergies  Allergen Reactions  . Other   . Sulfa Antibiotics     Medication list has been reviewed and updated.  Current Outpatient Prescriptions on File Prior to Visit  Medication Sig Dispense Refill  . Multiple Vitamin (MULTIVITAMIN) tablet Take 1  tablet by mouth daily.     No current facility-administered medications on file prior to visit.    ROS ROS otherwise unremarkable unless listed above.   Physical Examination: BP 140/80 mmHg  Pulse 77  Temp(Src) 98.1 F (36.7 C) (Oral)  Resp 18  Ht 5' 4.5" (1.638 m)  Wt 164 lb 3.2 oz (74.481 kg)  BMI 27.76 kg/m2  SpO2 100% Ideal Body Weight: Weight in (lb) to have BMI = 25: 147.6  Physical Exam  Constitutional: She is oriented to person, place, and time. She appears well-developed and well-nourished. No distress.  HENT:  Head: Normocephalic and atraumatic.  Right Ear: External ear normal.  Left Ear: External ear normal.  Eyes: Conjunctivae and EOM are normal. Pupils are equal, round, and reactive to light.  Cardiovascular: Normal rate.   Pulmonary/Chest: Effort normal. No respiratory distress.  Neurological: She is alert and oriented to person, place, and time.  Skin: She is not diaphoretic.  Psychiatric: She has a normal mood and affect. Her behavior is normal.     Assessment and Plan: Jenna Cox is a 55 y.o. female who is here today for immunizations. Given typhoid  vaccine prescription Advised doxy treatment as prophylaxis to malaria She will have to obtain typhoid vaccine from [facility]. Advice or immunization for travel - doxycycline (VIBRAMYCIN) 100 MG capsule; Take 1 capsule (100 mg total) by mouth daily.  Dispense: 51 capsule; Refill: 0 - typhoid (VIVOTIF) DR capsule; Take 1 capsule by mouth every other day. X 4 doses.  Do this 1 week prior to exposure.  Dispense: 4 capsule; Refill: 0   Ivar Drape, PA-C [facility] 03/01/2016 6:25 PM

## 2017-10-26 ENCOUNTER — Encounter: Payer: Self-pay | Admitting: Urgent Care

## 2017-10-26 ENCOUNTER — Ambulatory Visit: Payer: No Typology Code available for payment source | Admitting: Urgent Care

## 2017-10-26 VITALS — BP 130/80 | HR 80 | Temp 98.4°F | Resp 17 | Ht 64.0 in | Wt 175.0 lb

## 2017-10-26 DIAGNOSIS — R51 Headache: Secondary | ICD-10-CM | POA: Diagnosis not present

## 2017-10-26 DIAGNOSIS — R519 Headache, unspecified: Secondary | ICD-10-CM

## 2017-10-26 DIAGNOSIS — R0789 Other chest pain: Secondary | ICD-10-CM | POA: Diagnosis not present

## 2017-10-26 DIAGNOSIS — L989 Disorder of the skin and subcutaneous tissue, unspecified: Secondary | ICD-10-CM | POA: Diagnosis not present

## 2017-10-26 DIAGNOSIS — R030 Elevated blood-pressure reading, without diagnosis of hypertension: Secondary | ICD-10-CM | POA: Diagnosis not present

## 2017-10-26 NOTE — Patient Instructions (Addendum)
Nonspecific Chest Pain Chest pain can be caused by many different conditions. There is a chance that your pain could be related to something serious, such as a heart attack or a blood clot in your lungs. Chest pain can also be caused by conditions that are not life-threatening. If you have chest pain, it is very important to follow up with your doctor. Follow these instructions at home: Medicines  If you were prescribed an antibiotic medicine, take it as told by your doctor. Do not stop taking the antibiotic even if you start to feel better.  Take over-the-counter and prescription medicines only as told by your doctor. Lifestyle  Do not use any products that contain nicotine or tobacco, such as cigarettes and e-cigarettes. If you need help quitting, ask your doctor.  Do not drink alcohol.  Make lifestyle changes as told by your doctor. These may include: ? Getting regular exercise. Ask your doctor for some activities that are safe for you. ? Eating a heart-healthy diet. A diet specialist (dietitian) can help you to learn healthy eating options. ? Staying at a healthy weight. ? Managing diabetes, if needed. ? Lowering your stress, as with deep breathing or spending time in nature. General instructions  Avoid any activities that make you feel chest pain.  If your chest pain is because of heartburn: ? Raise (elevate) the head of your bed about 6 inches (15 cm). You can do this by putting blocks under the bed legs at the head of the bed. ? Do not sleep with extra pillows under your head. That does not help heartburn.  Keep all follow-up visits as told by your doctor. This is important. This includes any further testing if your chest pain does not go away. Contact a doctor if:  Your chest pain does not go away.  You have a rash with blisters on your chest.  You have a fever.  You have chills. Get help right away if:  Your chest pain is worse.  You have a cough that gets worse, or  you cough up blood.  You have very bad (severe) pain in your belly (abdomen).  You are very weak.  You pass out (faint).  You have either of these for no clear reason: ? Sudden chest discomfort. ? Sudden discomfort in your arms, back, neck, or jaw.  You have shortness of breath at any time.  You suddenly start to sweat, or your skin gets clammy.  You feel sick to your stomach (nauseous).  You throw up (vomit).  You suddenly feel light-headed or dizzy.  Your heart starts to beat fast, or it feels like it is skipping beats. These symptoms may be an emergency. Do not wait to see if the symptoms will go away. Get medical help right away. Call your local emergency services (911 in the [place] Do not drive yourself to the hospital. This information is not intended to replace advice given to you by your health care provider. Make sure you discuss any questions you have with your health care provider. Document Released: 02/10/2008 Document Revised: 05/18/2016 Document Reviewed: 05/18/2016 Elsevier Interactive Patient Education  2017 Elsevier Inc.     Preventing Hypertension Hypertension, commonly called high blood pressure, is when the force of blood pumping through the arteries is too strong. Arteries are blood vessels that carry blood from the heart throughout the body. Over time, hypertension can damage the arteries and decrease blood flow to important parts of the body, including the brain, heart, and  kidneys. Often, hypertension does not cause symptoms until blood pressure is very high. For this reason, it is important to have your blood pressure checked on a regular basis. Hypertension can often be prevented with diet and lifestyle changes. If you already have hypertension, you can control it with diet and lifestyle changes, as well as medicine. What nutrition changes can be made? Maintain a healthy diet. This includes:  Eating less salt (sodium). Ask your health care provider how  much sodium is safe for you to have. The general recommendation is to consume less than 1 tsp (2,300 mg) of sodium a day. ? Do not add salt to your food. ? Choose low-sodium options when grocery shopping and eating out.  Limiting fats in your diet. You can do this by eating low-fat or fat-free dairy products and by eating less red meat.  Eating more fruits, vegetables, and whole grains. Make a goal to eat: ? 1-2 cups of fresh fruits and vegetables each day. ? 3-4 servings of whole grains each day.  Avoiding foods and beverages that have added sugars.  Eating fish that contain healthy fats (omega-3 fatty acids), such as mackerel or salmon.  If you need help putting together a healthy eating plan, try the DASH diet. This diet is high in fruits, vegetables, and whole grains. It is low in sodium, red meat, and added sugars. DASH stands for Dietary Approaches to Stop Hypertension. What lifestyle changes can be made?  Lose weight if you are overweight. Losing just 3?5% of your body weight can help prevent or control hypertension. ? For example, if your present weight is 200 lb (91 kg), a loss of 3-5% of your weight means losing 6-10 lb (2.7-4.5 kg). ? Ask your health care provider to help you with a diet and exercise plan to safely lose weight.  Get enough exercise. Do at least 150 minutes of moderate-intensity exercise each week. ? You could do this in short exercise sessions several times a day, or you could do longer exercise sessions a few times a week. For example, you could take a brisk 10-minute walk or bike ride, 3 times a day, for 5 days a week.  Find ways to reduce stress, such as exercising, meditating, listening to music, or taking a yoga class. If you need help reducing stress, ask your health care provider.  Do not smoke. This includes e-cigarettes. Chemicals in tobacco and nicotine products raise your blood pressure each time you smoke. If you need help quitting, ask your health  care provider.  Avoid alcohol. If you drink alcohol, limit alcohol intake to no more than 1 drink a day for nonpregnant women and 2 drinks a day for men. One drink equals 12 oz of beer, 5 oz of wine, or 1 oz of hard liquor. Why are these changes important? Diet and lifestyle changes can help you prevent hypertension, and they may make you feel better overall and improve your quality of life. If you have hypertension, making these changes will help you control it and help prevent major complications, such as:  Hardening and narrowing of arteries that supply blood to: ? Your heart. This can cause a heart attack. ? Your brain. This can cause a stroke. ? Your kidneys. This can cause kidney failure.  Stress on your heart muscle, which can cause heart failure.  What can I do to lower my risk?  Work with your health care provider to make a hypertension prevention plan that works for you. Follow  your plan and keep all follow-up visits as told by your health care provider.  Learn how to check your blood pressure at home. Make sure that you know your personal target blood pressure, as told by your health care provider. How is this treated? In addition to diet and lifestyle changes, your health care provider may recommend medicines to help lower your blood pressure. You may need to try a few different medicines to find what works best for you. You also may need to take more than one medicine. Take over-the-counter and prescription medicines only as told by your health care provider. Where to find support: Your health care provider can help you prevent hypertension and help you keep your blood pressure at a healthy level. Your local hospital or your community may also provide support services and prevention programs. The American Heart Association offers an online support network at: CheapBootlegs.com.cy Where to find more information: Learn more about hypertension  from:  National Heart, Lung, and Blood Institute: ElectronicHangman.is  Centers for Disease Control and Prevention: https://ingram.com/  American Academy of Family Physicians: http://familydoctor.org/familydoctor/en/diseases-conditions/high-blood-pressure.printerview.all.html  Learn more about the DASH diet from:  [place], Lung, and [place]: https://www.reyes.com/  Contact a health care provider if:  You think you are having a reaction to medicines you have taken.  You have recurrent headaches or feel dizzy.  You have swelling in your ankles.  You have trouble with your vision. Summary  Hypertension often does not cause any symptoms until blood pressure is very high. It is important to get your blood pressure checked regularly.  Diet and lifestyle changes are the most important steps in preventing hypertension.  By keeping your blood pressure in a healthy range, you can prevent complications like heart attack, heart failure, stroke, and kidney failure.  Work with your health care provider to make a hypertension prevention plan that works for you. This information is not intended to replace advice given to you by your health care provider. Make sure you discuss any questions you have with your health care provider. Document Released: 09/08/2015 Document Revised: 05/04/2016 Document Reviewed: 05/04/2016 Elsevier Interactive Patient Education  2018 [employer].     IF you received an x-ray today, you will receive an invoice from [facility]. Please contact [facility] at [phone] with questions or concerns regarding your invoice.   IF you received labwork today, you will receive an invoice from [facility]. Please contact [facility] at [phone] with questions or concerns regarding your invoice.   Our billing staff will not be able to assist you with questions regarding bills from  these companies.  You will be contacted with the lab results as soon as they are available. The fastest way to get your results is to activate your My Chart account. Instructions are located on the last page of this paperwork. If you have not heard from [place] regarding the results in 2 weeks, please contact this office.

## 2017-10-26 NOTE — Progress Notes (Signed)
    MRN: [id number] DOB: 12-10-1960  Subjective:   Jenna Cox is a 57 y.o. female presenting for follow up on Hypertension. Has her blood pressure checked yesterday and was 154/90. She was working, teaches in middle school. Has a history of elevated blood pressure readings without diagnosis of HTN, has never taken medications for this. Avoids salt in diet, stays active. Reports occasional headaches, the last one prompted the blood pressure check at school. She also gets intermittent mid-sternal to left-sided stabbing, pressure type chest pain that lasts ~10 minutes, occurs randomly. Denies dizziness, chronic headaches, blurred vision, shortness of breath, heart racing, palpitations, nausea, vomiting, abdominal pain, hematuria, lower leg swelling. Denies smoking cigarettes or drinking alcohol.   Jenna Cox has a current medication list which includes the following prescription(s): multivitamin and typhoid. Also is allergic to other and sulfa antibiotics.  Jenna Cox  has a past medical history of Allergy, Asthma, Carpal tunnel syndrome of left wrist, Fibroids, Follicle cyst, H/O measles, H/O mumps, History of chicken pox, Pelvic pain, Recurrent UTI (urinary tract infection), Severe dysplasia of cervix, and Sickle cell trait ([place]). Also  has a past surgical history that includes Abdominal hysterectomy (1998); Cesarean section; Myomectomy; Tubal ligation; and Carpal tunnel release (2001).  Objective:   Vitals: BP 130/80   Pulse 80   Temp 98.4 F (36.9 C) (Oral)   Resp 17   Ht 5\' 4"  (1.626 m)   Wt 175 lb (79.4 kg)   SpO2 98%   BMI 30.04 kg/m   BP Readings from Last 3 Encounters:  10/26/17 130/80  02/19/16 140/80  10/27/13 130/88    Physical Exam  Constitutional: She is oriented to person, place, and time. She appears well-developed and well-nourished.  HENT:  Mouth/Throat: Oropharynx is clear and moist.  Eyes: EOM are normal. Pupils are equal, round, and reactive to light. No scleral  icterus.  Neck: No thyromegaly present.  Cardiovascular: Normal rate, regular rhythm and intact distal pulses. Exam reveals no gallop and no friction rub.  No murmur heard. Pulmonary/Chest: No respiratory distress. She has no wheezes. She has no rales.  Abdominal: Soft. Bowel sounds are normal. She exhibits no distension and no mass. There is no tenderness. There is no guarding.  Musculoskeletal: She exhibits no edema.  Neurological: She is alert and oriented to person, place, and time. She displays normal reflexes. No cranial nerve deficit.  Skin: Skin is warm and dry.     Psychiatric: She has a normal mood and affect.   ECG interpretation - Normal sinus rhythm at 71bpm. No acute findings, no previous ecg for comparison.  Assessment and Plan :   Pre-hypertension - Plan: Comprehensive metabolic panel, EKG 09-TOIZ  Generalized headaches  Chest tightness - Plan: EKG 12-Lead  Labs pending, patient is medically stable. Counseled on general health maintenance. She declined referral to cardiology. Referral to cardiology is pending.  Jaynee Eagles, PA-C [facility] [phone] 10/26/2017  5:50 PM

## 2017-10-27 LAB — COMPREHENSIVE METABOLIC PANEL
ALT: 19 IU/L (ref 0–32)
AST: 16 IU/L (ref 0–40)
Albumin/Globulin Ratio: 1.5 (ref 1.2–2.2)
Albumin: 4.2 g/dL (ref 3.5–5.5)
Alkaline Phosphatase: 70 IU/L (ref 39–117)
BILIRUBIN TOTAL: 0.2 mg/dL (ref 0.0–1.2)
BUN / CREAT RATIO: 18 (ref 9–23)
BUN: 13 mg/dL (ref 6–24)
CHLORIDE: 108 mmol/L — AB (ref 96–106)
CO2: 26 mmol/L (ref 20–29)
Calcium: 9.1 mg/dL (ref 8.7–10.2)
Creatinine, Ser: 0.74 mg/dL (ref 0.57–1.00)
GFR calc Af Amer: 103 mL/min/{1.73_m2} (ref >59)
GFR calc non Af Amer: 90 mL/min/{1.73_m2} (ref >59)
GLUCOSE: 87 mg/dL (ref 65–99)
Globulin, Total: 2.8 g/dL (ref 1.5–4.5)
Potassium: 4.1 mmol/L (ref 3.5–5.2)
Sodium: 148 mmol/L — ABNORMAL HIGH (ref 134–144)
Total Protein: 7 g/dL (ref 6.0–8.5)

## 2017-11-06 ENCOUNTER — Other Ambulatory Visit: Payer: Self-pay

## 2017-11-06 ENCOUNTER — Ambulatory Visit (INDEPENDENT_AMBULATORY_CARE_PROVIDER_SITE_OTHER): Payer: No Typology Code available for payment source | Admitting: Family Medicine

## 2017-11-06 ENCOUNTER — Encounter: Payer: Self-pay | Admitting: Family Medicine

## 2017-11-06 VITALS — BP 142/88 | HR 74 | Temp 98.4°F | Resp 16 | Ht 63.75 in | Wt 171.8 lb

## 2017-11-06 DIAGNOSIS — Z1231 Encounter for screening mammogram for malignant neoplasm of breast: Secondary | ICD-10-CM

## 2017-11-06 DIAGNOSIS — Z130 Encounter for screening for diseases of the blood and blood-forming organs and certain disorders involving the immune mechanism: Secondary | ICD-10-CM

## 2017-11-06 DIAGNOSIS — N390 Urinary tract infection, site not specified: Secondary | ICD-10-CM | POA: Diagnosis not present

## 2017-11-06 DIAGNOSIS — Z0001 Encounter for general adult medical examination with abnormal findings: Secondary | ICD-10-CM

## 2017-11-06 DIAGNOSIS — R102 Pelvic and perineal pain unspecified side: Secondary | ICD-10-CM

## 2017-11-06 DIAGNOSIS — Z90710 Acquired absence of both cervix and uterus: Secondary | ICD-10-CM

## 2017-11-06 DIAGNOSIS — Z136 Encounter for screening for cardiovascular disorders: Secondary | ICD-10-CM

## 2017-11-06 DIAGNOSIS — Z6829 Body mass index (BMI) 29.0-29.9, adult: Secondary | ICD-10-CM

## 2017-11-06 DIAGNOSIS — E559 Vitamin D deficiency, unspecified: Secondary | ICD-10-CM

## 2017-11-06 DIAGNOSIS — Z0000 Encounter for general adult medical examination without abnormal findings: Secondary | ICD-10-CM

## 2017-11-06 DIAGNOSIS — D069 Carcinoma in situ of cervix, unspecified: Secondary | ICD-10-CM

## 2017-11-06 DIAGNOSIS — Z1383 Encounter for screening for respiratory disorder NEC: Secondary | ICD-10-CM

## 2017-11-06 DIAGNOSIS — Z113 Encounter for screening for infections with a predominantly sexual mode of transmission: Secondary | ICD-10-CM

## 2017-11-06 DIAGNOSIS — Z1389 Encounter for screening for other disorder: Secondary | ICD-10-CM

## 2017-11-06 DIAGNOSIS — Z1329 Encounter for screening for other suspected endocrine disorder: Secondary | ICD-10-CM

## 2017-11-06 DIAGNOSIS — D649 Anemia, unspecified: Secondary | ICD-10-CM

## 2017-11-06 DIAGNOSIS — R030 Elevated blood-pressure reading, without diagnosis of hypertension: Secondary | ICD-10-CM

## 2017-11-06 LAB — POCT URINALYSIS DIP (MANUAL ENTRY)
BILIRUBIN UA: NEGATIVE
BILIRUBIN UA: NEGATIVE mg/dL
Glucose, UA: NEGATIVE mg/dL
Leukocytes, UA: NEGATIVE
NITRITE UA: NEGATIVE
PH UA: 7 (ref 5.0–8.0)
Protein Ur, POC: NEGATIVE mg/dL
RBC UA: NEGATIVE
Spec Grav, UA: 1.015 (ref 1.010–1.025)
Urobilinogen, UA: 0.2 E.U./dL

## 2017-11-06 LAB — POC MICROSCOPIC URINALYSIS (UMFC): Mucus: ABSENT

## 2017-11-06 NOTE — Progress Notes (Addendum)
Subjective:  By signing my name below, I, Moises Blood, attest that this documentation has been prepared under the direction and in the presence of Delman Cheadle, MD. Electronically Signed: Moises Blood, [place]. 11/06/2017 , 8:44 AM .  Patient was seen in Room 1 .   Patient ID: Jenna Cox, female    DOB: November 17, 1960, 57 y.o.   MRN: [id number] Chief Complaint  Patient presents with  . Annual Exam   HPI  First time meeting this patient. She was seen 2 weeks ago by Jaynee Eagles, PA-C, to follow up on elevated BP with complaints of occasional headaches and atypical left sided chest pain. She's not on any antihypertensives and her EKG was normal; patient refused referral to cardiology.   ROS complaint Foot pain: sees podiatry Pelvic pain: no changes, pain fluctuates Bilateral hand numbness: intermittent, history of carpal tunnel syndrome in both hands; surgery done in right hand Season allergies.   Primary Preventative Screenings: Cervical Cancer: Her prior pap smear was in [place], [place] in July 2014, which was normal. She has had a history of abnormal pap smears with severe cervical dysplasia with LEEP. She then had a partial hysterectomy in 1998 due to fibroids with normal pap smears since, and followed with OBGYN at [place] since. She has chronic pelvic pain due to prior surgeries. She reports last pap smear was in 2017, in [place].  Breast Cancer: mammogram have been normal, last done in July 2018.  Colorectal Cancer: Last colonoscopy done in 2012 in [facility], with 1 polyp; does not know if recall was 5-10 years.  Weight/Blood sugar/Diet/Exercise: she tries to get in some exercise. Spent time discussing ways to increase exercise to lower BP; patient decided to start adding daily hand weights while walking up and down stairs every day. Goal to lose 30 lbs.   BMI Readings from Last 3 Encounters:  11/06/17 29.72 kg/m  10/26/17 30.04 kg/m  02/19/16 27.75  kg/m   No results found for: HGBA1C OTC/Vit/Supp/Herbal: takes [employer] women's multivitamin and additional vitamin D.  Immunizations:  Immunization History  Administered Date(s) Administered  . Hepatitis A 07/31/1994, 08/21/2011  . IPV 08/25/1991, 07/04/1997, 04/01/2004  . MMR 07/31/1994  . Td 03/13/1985, 07/31/1994, 03/12/2004  . Tdap 08/21/2011  . Typhoid Live 04/01/2004  . Typhoid Parenteral 08/21/2011  . Yellow Fever 04/01/2004    Chronic Medical Conditions: Elevated BP: She states sometimes checking her BP, usually running around 130s/80s. When she's stressed and tired, usually fluctuates up to 140s. Prior to coming in today, she was in 150/90s.   Past Medical History:  Diagnosis Date  . Allergy   . Asthma   . Carpal tunnel syndrome of left wrist   . Fibroids   . Follicle cyst   . H/O measles   . H/O mumps   . History of chicken pox   . Pelvic pain   . Recurrent UTI (urinary tract infection)   . Severe dysplasia of cervix   . Sickle cell trait [facility])    Past Surgical History:  Procedure Laterality Date  . ABDOMINAL HYSTERECTOMY  1998  . CARPAL TUNNEL RELEASE  2001  . CESAREAN SECTION    . MYOMECTOMY    . TUBAL LIGATION     Current Outpatient Medications on File Prior to Visit  Medication Sig Dispense Refill  . Multiple Vitamin (MULTIVITAMIN) tablet Take 1 tablet by mouth daily.     No current facility-administered medications on file prior to visit.    Allergies  Allergen Reactions  . Other   . Sulfa Antibiotics    Family History  Problem Relation Age of Onset  . Cancer Paternal Aunt   . Cancer Father   . Diabetes Father   . Hyperlipidemia Father   . Hypertension Father   . Stroke Father   . Sickle cell trait Brother    Social History   Socioeconomic History  . Marital status: Married    Spouse name: None  . Number of children: None  . Years of education: None  . Highest education level: None  Social Needs  . Financial resource strain:  None  . Food insecurity - worry: None  . Food insecurity - inability: None  . Transportation needs - medical: None  . Transportation needs - non-medical: None  Occupational History  . Occupation: Herbalist  Tobacco Use  . Smoking status: Never Smoker  . Smokeless tobacco: Never Used  Substance and Sexual Activity  . Alcohol use: No  . Drug use: No  . Sexual activity: Yes    Birth control/protection: None  Other Topics Concern  . None  Social History Narrative  . None   Depression screen [facility] 2/9 11/06/2017 10/26/2017 02/19/2016  Decreased Interest 0 0 0  Down, Depressed, Hopeless 0 0 0  PHQ - 2 Score 0 0 0     Review of Systems  Genitourinary: Positive for dyspareunia and pelvic pain.  Musculoskeletal: Positive for arthralgias (foot).  Allergic/Immunologic: Positive for environmental allergies.  Neurological: Positive for numbness.  All other systems reviewed and are negative.     Objective:   Physical Exam  Constitutional: She is oriented to person, place, and time. She appears well-developed and well-nourished. No distress.  HENT:  Head: Normocephalic and atraumatic.  Right Ear: Tympanic membrane, external ear and ear canal normal.  Left Ear: Tympanic membrane, external ear and ear canal normal.  Nose: Nose normal. No mucosal edema or rhinorrhea.  Mouth/Throat: Uvula is midline, oropharynx is clear and moist and mucous membranes are normal. No posterior oropharyngeal erythema.  Eyes: Conjunctivae and EOM are normal. Pupils are equal, round, and reactive to light. Right eye exhibits no discharge. Left eye exhibits no discharge. No scleral icterus.  Neck: Normal range of motion. Neck supple. No thyromegaly present.  Cardiovascular: Normal rate, regular rhythm, normal heart sounds and intact distal pulses.  Pulmonary/Chest: Effort normal and breath sounds normal. No respiratory distress.  Abdominal: Soft. Bowel sounds are normal. There is no tenderness.    Genitourinary: No breast swelling, tenderness, discharge or bleeding.  Musculoskeletal: Normal range of motion. She exhibits no edema.  Lymphadenopathy:    She has no cervical adenopathy.    She has no axillary adenopathy.       Right: No supraclavicular adenopathy present.       Left: No supraclavicular adenopathy present.  Neurological: She is alert and oriented to person, place, and time. She has normal reflexes.  Skin: Skin is warm and dry. She is not diaphoretic. No erythema.  Psychiatric: She has a normal mood and affect. Her behavior is normal.  Nursing note and vitals reviewed.   BP (!) 148/89   Pulse 74   Temp 98.4 F (36.9 C) (Oral)   Resp 16   Ht 5' 3.75" (1.619 m)   Wt 171 lb 12.8 oz (77.9 kg)   SpO2 100%   BMI 29.72 kg/m   [8:41 AM] BP recheck, left arm: 142/88   Visual Acuity Screening   Right eye Left eye Both  eyes  Without correction:     With correction: _0       Results for orders placed or performed in visit on 11/06/17  POCT urinalysis dipstick  Result Value Ref Range   Color, UA yellow yellow   Clarity, UA clear clear   Glucose, UA negative negative mg/dL   Bilirubin, UA negative negative   Ketones, POC UA negative negative mg/dL   Spec Grav, UA 1.015 1.010 - 1.025   Blood, UA negative negative   pH, UA 7.0 5.0 - 8.0   Protein Ur, POC negative negative mg/dL   Urobilinogen, UA 0.2 0.2 or 1.0 E.U./dL   Nitrite, UA Negative Negative   Leukocytes, UA Negative Negative  POCT Microscopic Urinalysis ([facility])  Result Value Ref Range   WBC,UR,HPF,POC None None WBC/hpf   RBC,UR,HPF,POC None None RBC/hpf   Bacteria Few (A) None, Too numerous to count   Mucus Absent Absent   Epithelial Cells, UR Per Microscopy None None, Too numerous to count cells/hpf    Assessment & Plan:   1. Annual physical exam   2. Screening for cardiovascular, respiratory, and genitourinary diseases   3. Encounter for screening mammogram for breast cancer -  done at [facility]  4. Routine screening for STI (sexually transmitted infection)   5. Screening for deficiency anemia   6. Screening for thyroid disorder   7. Severe dysplasia of cervix - advised should continue with routine pap smears due to significant dysplasia prior to hysterectomy so cannot GUARENTEE that hysterectomy was for benign indications  8. Urinary tract infection, recurrent   9. H/O hysterectomy for benign disease   10. Vitamin D deficiency - taking otc qd in unknown quant  11. Pre-hypertension - advised benefit of trying low dose BP med as goal BP not <140/90 but rather try to aim for 110s/70s but pt declined - really wants to try to avoid daily med. Will work to lower BP by increasing exercise - she will try to increase speed walking/stairs for aerobic and add in free weights during the aerobic activity  12. Pelvic pain - per pt chronic due to prior surgeries and unchanged over-all - waxes/wanes  13.    BMI 29-29.9 adult - pt's goal is to lose 30 lbs - reviewed exercise recs    Orders Placed This Encounter  Procedures  . CBC with Differential/Platelet  . Basic metabolic panel    Order Specific Question:   Has the patient fasted?    Answer:   No  . Lipid panel    Order Specific Question:   Has the patient fasted?    Answer:   No  . VITAMIN D 25 Hydroxy (Vit-D Deficiency, Fractures)  . TSH  . HIV antibody  . Hepatitis C Antibody  . POCT urinalysis dipstick  . POCT Microscopic Urinalysis ([facility])     I personally performed the services described in this documentation, which was scribed in my presence. The recorded information has been reviewed and considered, and addended by me as needed.   Delman Cheadle, M.D.  Primary Care at [facility] [address] [place], [place] [address] [phone] phone [phone] fax  11/06/17 4:18 PM

## 2017-11-06 NOTE — Patient Instructions (Addendum)
IF you received an x-ray today, you will receive an invoice from [facility]. Please contact [facility] at [phone] with questions or concerns regarding your invoice.   IF you received labwork today, you will receive an invoice from [place]. Please contact [facility] at [phone] with questions or concerns regarding your invoice.   Our billing staff will not be able to assist you with questions regarding bills from these companies.  You will be contacted with the lab results as soon as they are available. The fastest way to get your results is to activate your My Chart account. Instructions are located on the last page of this paperwork. If you have not heard from [place] regarding the results in 2 weeks, please contact this office.     DASH Eating Plan DASH stands for "Dietary Approaches to Stop Hypertension." The DASH eating plan is a healthy eating plan that has been shown to reduce high blood pressure (hypertension). It may also reduce your risk for type 2 diabetes, heart disease, and stroke. The DASH eating plan may also help with weight loss. What are tips for following this plan? General guidelines  Avoid eating more than 2,300 mg (milligrams) of salt (sodium) a day. If you have hypertension, you may need to reduce your sodium intake to 1,500 mg a day.  Limit alcohol intake to no more than 1 drink a day for nonpregnant women and 2 drinks a day for men. One drink equals 12 oz of beer, 5 oz of wine, or 1 oz of hard liquor.  Work with your health care provider to maintain a healthy body weight or to lose weight. Ask what an ideal weight is for you.  Get at least 30 minutes of exercise that causes your heart to beat faster (aerobic exercise) most days of the week. Activities may include walking, swimming, or biking.  Work with your health care provider or diet and nutrition specialist (dietitian) to adjust your eating plan to your individual calorie  needs. Reading food labels  Check food labels for the amount of sodium per serving. Choose foods with less than 5 percent of the Daily Value of sodium. Generally, foods with less than 300 mg of sodium per serving fit into this eating plan.  To find whole grains, look for the word "whole" as the first word in the ingredient list. Shopping  Buy products labeled as "low-sodium" or "no salt added."  Buy fresh foods. Avoid canned foods and premade or frozen meals. Cooking  Avoid adding salt when cooking. Use salt-free seasonings or herbs instead of table salt or sea salt. Check with your health care provider or pharmacist before using salt substitutes.  Do not fry foods. Cook foods using healthy methods such as baking, boiling, grilling, and broiling instead.  Cook with heart-healthy oils, such as olive, canola, soybean, or sunflower oil. Meal planning   Eat a balanced diet that includes: ? 5 or more servings of fruits and vegetables each day. At each meal, try to fill half of your plate with fruits and vegetables. ? Up to 6-8 servings of whole grains each day. ? Less than 6 oz of lean meat, poultry, or fish each day. A 3-oz serving of meat is about the same size as a deck of cards. One egg equals 1 oz. ? 2 servings of low-fat dairy each day. ? A serving of nuts, seeds, or beans 5 times each week. ? Heart-healthy fats. Healthy fats called Omega-3 fatty acids are found  in foods such as flaxseeds and coldwater fish, like sardines, salmon, and mackerel.  Limit how much you eat of the following: ? Canned or prepackaged foods. ? Food that is high in trans fat, such as fried foods. ? Food that is high in saturated fat, such as fatty meat. ? Sweets, desserts, sugary drinks, and other foods with added sugar. ? Full-fat dairy products.  Do not salt foods before eating.  Try to eat at least 2 vegetarian meals each week.  Eat more home-cooked food and less restaurant, buffet, and fast  food.  When eating at a restaurant, ask that your food be prepared with less salt or no salt, if possible. What foods are recommended? The items listed may not be a complete list. Talk with your dietitian about what dietary choices are best for you. Grains Whole-grain or whole-wheat bread. Whole-grain or whole-wheat pasta. Brown rice. [place]. Bulgur. Whole-grain and low-sodium cereals. Pita bread. Low-fat, low-sodium crackers. Whole-wheat flour tortillas. Vegetables Fresh or frozen vegetables (raw, steamed, roasted, or grilled). Low-sodium or reduced-sodium tomato and vegetable juice. Low-sodium or reduced-sodium tomato sauce and tomato paste. Low-sodium or reduced-sodium canned vegetables. Fruits All fresh, dried, or frozen fruit. Canned fruit in natural juice (without added sugar). Meat and other protein foods Skinless chicken or [place]. Ground chicken or [place]. Pork with fat trimmed off. Fish and seafood. Egg whites. Dried beans, peas, or lentils. Unsalted nuts, nut butters, and seeds. Unsalted canned beans. Lean cuts of beef with fat trimmed off. Low-sodium, lean deli meat. Dairy Low-fat (1%) or fat-free (skim) milk. Fat-free, low-fat, or reduced-fat cheeses. Nonfat, low-sodium ricotta or cottage cheese. Low-fat or nonfat yogurt. Low-fat, low-sodium cheese. Fats and oils Soft margarine without trans fats. Vegetable oil. Low-fat, reduced-fat, or light mayonnaise and salad dressings (reduced-sodium). Canola, safflower, olive, soybean, and sunflower oils. Avocado. Seasoning and other foods Herbs. Spices. Seasoning mixes without salt. Unsalted popcorn and pretzels. Fat-free sweets. What foods are not recommended? The items listed may not be a complete list. Talk with your dietitian about what dietary choices are best for you. Grains Baked goods made with fat, such as croissants, muffins, or some breads. Dry pasta or rice meal packs. Vegetables Creamed or fried vegetables. Vegetables  in a cheese sauce. Regular canned vegetables (not low-sodium or reduced-sodium). Regular canned tomato sauce and paste (not low-sodium or reduced-sodium). Regular tomato and vegetable juice (not low-sodium or reduced-sodium). Angie Fava. Olives. Fruits Canned fruit in a light or heavy syrup. Fried fruit. Fruit in cream or butter sauce. Meat and other protein foods Fatty cuts of meat. Ribs. Fried meat. Berniece Salines. Sausage. Bologna and other processed lunch meats. Salami. Fatback. Hotdogs. Bratwurst. Salted nuts and seeds. Canned beans with added salt. Canned or smoked fish. Whole eggs or egg yolks. Chicken or [place] with skin. Dairy Whole or 2% milk, cream, and half-and-half. Whole or full-fat cream cheese. Whole-fat or sweetened yogurt. Full-fat cheese. Nondairy creamers. Whipped toppings. Processed cheese and cheese spreads. Fats and oils Butter. Stick margarine. Lard. Shortening. Ghee. Bacon fat. Tropical oils, such as coconut, palm kernel, or palm oil. Seasoning and other foods Salted popcorn and pretzels. Onion salt, garlic salt, seasoned salt, table salt, and sea salt. Worcestershire sauce. Tartar sauce. Barbecue sauce. Teriyaki sauce. Soy sauce, including reduced-sodium. Steak sauce. Canned and packaged gravies. Fish sauce. Oyster sauce. Cocktail sauce. Horseradish that you find on the shelf. Ketchup. Mustard. Meat flavorings and tenderizers. Bouillon cubes. Hot sauce and Tabasco sauce. Premade or packaged marinades. Premade or packaged taco seasonings. Relishes.  Regular salad dressings. Where to find more information:  National Heart, Lung, and [place]: https://wilson-eaton.com/  American Heart Association: www.heart.org Summary  The DASH eating plan is a healthy eating plan that has been shown to reduce high blood pressure (hypertension). It may also reduce your risk for type 2 diabetes, heart disease, and stroke.  With the DASH eating plan, you should limit salt (sodium) intake to 2,300 mg a  day. If you have hypertension, you may need to reduce your sodium intake to 1,500 mg a day.  When on the DASH eating plan, aim to eat more fresh fruits and vegetables, whole grains, lean proteins, low-fat dairy, and heart-healthy fats.  Work with your health care provider or diet and nutrition specialist (dietitian) to adjust your eating plan to your individual calorie needs. This information is not intended to replace advice given to you by your health care provider. Make sure you discuss any questions you have with your health care provider. Document Released: 08/13/2011 Document Revised: 08/17/2016 Document Reviewed: 08/17/2016 Elsevier Interactive Patient Education  2018 [place] Maintenance for Postmenopausal Women Menopause is a normal process in which your reproductive ability comes to an end. This process happens gradually over a span of months to years, usually between the ages of 84 and 23. Menopause is complete when you have missed 12 consecutive menstrual periods. It is important to talk with your health care provider about some of the most common conditions that affect postmenopausal women, such as heart disease, cancer, and bone loss (osteoporosis). Adopting a healthy lifestyle and getting preventive care can help to promote your health and wellness. Those actions can also lower your chances of developing some of these common conditions. What should I know about menopause? During menopause, you may experience a number of symptoms, such as:  Moderate-to-severe hot flashes.  Night sweats.  Decrease in sex drive.  Mood swings.  Headaches.  Tiredness.  Irritability.  Memory problems.  Insomnia.  Choosing to treat or not to treat menopausal changes is an individual decision that you make with your health care provider. What should I know about hormone replacement therapy and supplements? Hormone therapy products are effective for treating symptoms that are  associated with menopause, such as hot flashes and night sweats. Hormone replacement carries certain risks, especially as you become older. If you are thinking about using estrogen or estrogen with progestin treatments, discuss the benefits and risks with your health care provider. What should I know about heart disease and stroke? Heart disease, heart attack, and stroke become more likely as you age. This may be due, in part, to the hormonal changes that your body experiences during menopause. These can affect how your body processes dietary fats, triglycerides, and cholesterol. Heart attack and stroke are both medical emergencies. There are many things that you can do to help prevent heart disease and stroke:  Have your blood pressure checked at least every 1-2 years. High blood pressure causes heart disease and increases the risk of stroke.  If you are 68-67 years old, ask your health care provider if you should take aspirin to prevent a heart attack or a stroke.  Do not use any tobacco products, including cigarettes, chewing tobacco, or electronic cigarettes. If you need help quitting, ask your health care provider.  It is important to eat a healthy diet and maintain a healthy weight. ? Be sure to include plenty of vegetables, fruits, low-fat dairy products, and lean protein. ? Avoid eating foods  that are high in solid fats, added sugars, or salt (sodium).  Get regular exercise. This is one of the most important things that you can do for your health. ? Try to exercise for at least 150 minutes each week. The type of exercise that you do should increase your heart rate and make you sweat. This is known as moderate-intensity exercise. ? Try to do strengthening exercises at least twice each week. Do these in addition to the moderate-intensity exercise.  Know your numbers.Ask your health care provider to check your cholesterol and your blood glucose. Continue to have your blood tested as directed  by your health care provider.  What should I know about cancer screening? There are several types of cancer. Take the following steps to reduce your risk and to catch any cancer development as early as possible. Breast Cancer  Practice breast self-awareness. ? This means understanding how your breasts normally appear and feel. ? It also means doing regular breast self-exams. Let your health care provider know about any changes, no matter how small.  If you are 89 or older, have a clinician do a breast exam (clinical breast exam or CBE) every year. Depending on your age, family history, and medical history, it may be recommended that you also have a yearly breast X-ray (mammogram).  If you have a family history of breast cancer, talk with your health care provider about genetic screening.  If you are at high risk for breast cancer, talk with your health care provider about having an MRI and a mammogram every year.  Breast cancer (BRCA) gene test is recommended for women who have family members with BRCA-related cancers. Results of the assessment will determine the need for genetic counseling and BRCA1 and for BRCA2 testing. BRCA-related cancers include these types: ? Breast. This occurs in males or females. ? Ovarian. ? Tubal. This may also be called fallopian tube cancer. ? Cancer of the abdominal or pelvic lining (peritoneal cancer). ? Prostate. ? Pancreatic.  Cervical, Uterine, and Ovarian Cancer Your health care provider may recommend that you be screened regularly for cancer of the pelvic organs. These include your ovaries, uterus, and vagina. This screening involves a pelvic exam, which includes checking for microscopic changes to the surface of your cervix (Pap test).  For women ages 21-65, health care providers may recommend a pelvic exam and a Pap test every three years. For women ages 24-65, they may recommend the Pap test and pelvic exam, combined with testing for human papilloma  virus (HPV), every five years. Some types of HPV increase your risk of cervical cancer. Testing for HPV may also be done on women of any age who have unclear Pap test results.  Other health care providers may not recommend any screening for nonpregnant women who are considered low risk for pelvic cancer and have no symptoms. Ask your health care provider if a screening pelvic exam is right for you.  If you have had past treatment for cervical cancer or a condition that could lead to cancer, you need Pap tests and screening for cancer for at least 20 years after your treatment. If Pap tests have been discontinued for you, your risk factors (such as having a new sexual partner) need to be reassessed to determine if you should start having screenings again. Some women have medical problems that increase the chance of getting cervical cancer. In these cases, your health care provider may recommend that you have screening and Pap tests more often.  If you have a family history of uterine cancer or ovarian cancer, talk with your health care provider about genetic screening.  If you have vaginal bleeding after reaching menopause, tell your health care provider.  There are currently no reliable tests available to screen for ovarian cancer.  Lung Cancer Lung cancer screening is recommended for adults 71-66 years old who are at high risk for lung cancer because of a history of smoking. A yearly low-dose CT scan of the lungs is recommended if you:  Currently smoke.  Have a history of at least 30 pack-years of smoking and you currently smoke or have quit within the past 15 years. A pack-year is smoking an average of one pack of cigarettes per day for one year.  Yearly screening should:  Continue until it has been 15 years since you quit.  Stop if you develop a health problem that would prevent you from having lung cancer treatment.  Colorectal Cancer  This type of cancer can be detected and can often  be prevented.  Routine colorectal cancer screening usually begins at age 39 and continues through age 78.  If you have risk factors for colon cancer, your health care provider may recommend that you be screened at an earlier age.  If you have a family history of colorectal cancer, talk with your health care provider about genetic screening.  Your health care provider may also recommend using home test kits to check for hidden blood in your stool.  A small camera at the end of a tube can be used to examine your colon directly (sigmoidoscopy or colonoscopy). This is done to check for the earliest forms of colorectal cancer.  Direct examination of the colon should be repeated every 5-10 years until age 22. However, if early forms of precancerous polyps or small growths are found or if you have a family history or genetic risk for colorectal cancer, you may need to be screened more often.  Skin Cancer  Check your skin from head to toe regularly.  Monitor any moles. Be sure to tell your health care provider: ? About any new moles or changes in moles, especially if there is a change in a mole's shape or color. ? If you have a mole that is larger than the size of a pencil eraser.  If any of your family members has a history of skin cancer, especially at a young age, talk with your health care provider about genetic screening.  Always use sunscreen. Apply sunscreen liberally and repeatedly throughout the day.  Whenever you are outside, protect yourself by wearing long sleeves, pants, a wide-brimmed hat, and sunglasses.  What should I know about osteoporosis? Osteoporosis is a condition in which bone destruction happens more quickly than new bone creation. After menopause, you may be at an increased risk for osteoporosis. To help prevent osteoporosis or the bone fractures that can happen because of osteoporosis, the following is recommended:  If you are 22-85 years old, get at least 1,000 mg of  calcium and at least 600 mg of vitamin D per day.  If you are older than age 41 but younger than age 21, get at least 1,200 mg of calcium and at least 600 mg of vitamin D per day.  If you are older than age 32, get at least 1,200 mg of calcium and at least 800 mg of vitamin D per day.  Smoking and excessive alcohol intake increase the risk of osteoporosis. Eat foods that are rich  in calcium and vitamin D, and do weight-bearing exercises several times each week as directed by your health care provider. What should I know about how menopause affects my mental health? Depression may occur at any age, but it is more common as you become older. Common symptoms of depression include:  Low or sad mood.  Changes in sleep patterns.  Changes in appetite or eating patterns.  Feeling an overall lack of motivation or enjoyment of activities that you previously enjoyed.  Frequent crying spells.  Talk with your health care provider if you think that you are experiencing depression. What should I know about immunizations? It is important that you get and maintain your immunizations. These include:  Tetanus, diphtheria, and pertussis (Tdap) booster vaccine.  Influenza every year before the flu season begins.  Pneumonia vaccine.  Shingles vaccine.  Your health care provider may also recommend other immunizations. This information is not intended to replace advice given to you by your health care provider. Make sure you discuss any questions you have with your health care provider. Document Released: 10/16/2005 Document Revised: 03/13/2016 Document Reviewed: 05/28/2015 Elsevier Interactive Patient Education  2018 [employer].  Exercising to [employer] Exercising can help you to lose weight. In order to lose weight through exercise, you need to do vigorous-intensity exercise. You can tell that you are exercising with vigorous intensity if you are breathing very hard and fast and cannot hold a  conversation while exercising. Moderate-intensity exercise helps to maintain your current weight. You can tell that you are exercising at a moderate level if you have a higher heart rate and faster breathing, but you are still able to hold a conversation. How often should I exercise? Choose an activity that you enjoy and set realistic goals. Your health care provider can help you to make an activity plan that works for you. Exercise regularly as directed by your health care provider. This may include:  Doing resistance training twice each week, such as: ? Push-ups. ? Sit-ups. ? Lifting weights. ? Using resistance bands.  Doing a given intensity of exercise for a given amount of time. Choose from these options: ? 150 minutes of moderate-intensity exercise every week. ? 75 minutes of vigorous-intensity exercise every week. ? A mix of moderate-intensity and vigorous-intensity exercise every week.  Children, pregnant women, people who are out of shape, people who are overweight, and older adults may need to consult a health care provider for individual recommendations. If you have any sort of medical condition, be sure to consult your health care provider before starting a new exercise program. What are some activities that can help me to lose weight?  Walking at a rate of at least 4.5 miles an hour.  Jogging or running at a rate of 5 miles per hour.  Biking at a rate of at least 10 miles per hour.  Lap swimming.  Roller-skating or in-line skating.  Cross-country skiing.  Vigorous competitive sports, such as football, basketball, and soccer.  Jumping rope.  Aerobic dancing. How can I be more active in my day-to-day activities?  Use the stairs instead of the elevator.  Take a walk during your lunch break.  If you drive, park your car farther away from work or school.  If you take public transportation, get off one stop early and walk the rest of the way.  Make all of your  phone calls while standing up and walking around.  Get up, stretch, and walk around every 30 minutes throughout  the day. What guidelines should I follow while exercising?  Do not exercise so much that you hurt yourself, feel dizzy, or get very short of breath.  Consult your health care provider prior to starting a new exercise program.  Wear comfortable clothes and shoes with good support.  Drink plenty of water while you exercise to prevent dehydration or heat stroke. Body water is lost during exercise and must be replaced.  Work out until you breathe faster and your heart beats faster. This information is not intended to replace advice given to you by your health care provider. Make sure you discuss any questions you have with your health care provider. Document Released: 09/26/2010 Document Revised: 01/30/2016 Document Reviewed: 01/25/2014 Elsevier Interactive Patient Education  [employer].

## 2017-11-09 ENCOUNTER — Encounter: Payer: Self-pay | Admitting: Family Medicine

## 2017-11-09 ENCOUNTER — Encounter: Payer: Self-pay | Admitting: Radiology

## 2017-11-09 DIAGNOSIS — D649 Anemia, unspecified: Secondary | ICD-10-CM | POA: Insufficient documentation

## 2017-11-09 DIAGNOSIS — D573 Sickle-cell trait: Secondary | ICD-10-CM | POA: Insufficient documentation

## 2017-11-09 NOTE — Addendum Note (Signed)
Addended by: Gari Crown D on: 11/09/2017 04:30 PM   Modules accepted: Orders

## 2017-11-11 LAB — LIPID PANEL
Cholesterol: 172 mg/dL (ref <200)
HDL: 43 mg/dL — ABNORMAL LOW (ref >50)
LDL CHOLESTEROL (CALC): 118 mg/dL — AB
NON-HDL CHOLESTEROL (CALC): 129 mg/dL (ref <130)
Total CHOL/HDL Ratio: 4 (calc) (ref <5.0)
Triglycerides: 39 mg/dL (ref <150)

## 2017-11-11 LAB — CBC WITH DIFFERENTIAL/PLATELET
BASOS ABS: 53 {cells}/uL (ref 0–200)
Basophils Relative: 0.8 %
EOS ABS: 198 {cells}/uL (ref 15–500)
Eosinophils Relative: 3 %
HCT: 32.9 % — ABNORMAL LOW (ref 35.0–45.0)
HEMOGLOBIN: 11.2 g/dL — AB (ref 11.7–15.5)
LYMPHS ABS: 1439 {cells}/uL (ref 850–3900)
MCH: 29.3 pg (ref 27.0–33.0)
MCHC: 34 g/dL (ref 32.0–36.0)
MCV: 86.1 fL (ref 80.0–100.0)
MPV: 12.1 fL (ref 7.5–12.5)
Monocytes Relative: 5.2 %
NEUTROS ABS: 4567 {cells}/uL (ref 1500–7800)
Neutrophils Relative %: 69.2 %
Platelets: 240 10*3/uL (ref 140–400)
RBC: 3.82 10*6/uL (ref 3.80–5.10)
RDW: 14 % (ref 11.0–15.0)
Total Lymphocyte: 21.8 %
WBC: 6.6 10*3/uL (ref 3.8–10.8)
WBCMIX: 343 {cells}/uL (ref 200–950)

## 2017-11-11 LAB — BASIC METABOLIC PANEL
BUN: 14 mg/dL (ref 7–25)
CALCIUM: 9.1 mg/dL (ref 8.6–10.4)
CO2: 32 mmol/L (ref 20–32)
Chloride: 110 mmol/L (ref 98–110)
Creat: 0.81 mg/dL (ref 0.50–1.05)
Glucose, Bld: 86 mg/dL (ref 65–99)
Potassium: 3.9 mmol/L (ref 3.5–5.3)
SODIUM: 145 mmol/L (ref 135–146)

## 2017-11-11 LAB — TEST AUTHORIZATION

## 2017-11-11 LAB — HIV ANTIBODY (ROUTINE TESTING W REFLEX): HIV 1&2 Ab, 4th Generation: NONREACTIVE

## 2017-11-11 LAB — HEPATITIS C ANTIBODY
Hepatitis C Ab: NONREACTIVE
SIGNAL TO CUT-OFF: 0.01 (ref <1.00)

## 2017-11-11 LAB — FERRITIN: FERRITIN: 208 ng/mL (ref 10–232)

## 2017-11-11 LAB — TSH: TSH: 0.41 m[IU]/L (ref 0.40–4.50)

## 2017-11-11 LAB — VITAMIN D 25 HYDROXY (VIT D DEFICIENCY, FRACTURES): VIT D 25 HYDROXY: 34 ng/mL (ref 30–100)

## 2017-12-02 ENCOUNTER — Ambulatory Visit: Payer: PRIVATE HEALTH INSURANCE | Admitting: Podiatry

## 2017-12-16 ENCOUNTER — Ambulatory Visit: Payer: PRIVATE HEALTH INSURANCE | Admitting: Podiatry

## 2017-12-30 ENCOUNTER — Encounter: Payer: Self-pay | Admitting: Podiatry

## 2017-12-30 ENCOUNTER — Ambulatory Visit (INDEPENDENT_AMBULATORY_CARE_PROVIDER_SITE_OTHER): Payer: No Typology Code available for payment source | Admitting: Podiatry

## 2017-12-30 ENCOUNTER — Ambulatory Visit (INDEPENDENT_AMBULATORY_CARE_PROVIDER_SITE_OTHER): Payer: No Typology Code available for payment source

## 2017-12-30 VITALS — BP 140/89 | HR 79 | Resp 16 | Ht 64.0 in | Wt 170.0 lb

## 2017-12-30 DIAGNOSIS — M2011 Hallux valgus (acquired), right foot: Secondary | ICD-10-CM

## 2017-12-30 DIAGNOSIS — B079 Viral wart, unspecified: Secondary | ICD-10-CM | POA: Diagnosis not present

## 2017-12-30 DIAGNOSIS — M2012 Hallux valgus (acquired), left foot: Secondary | ICD-10-CM

## 2017-12-30 NOTE — Progress Notes (Signed)
   Subjective:    Patient ID: Jenna Cox, female    DOB: [birth year], 59 y.o.   MRN: [id number]  HPI    Review of Systems  HENT: Positive for sinus pain and sneezing.   Eyes: Positive for itching.  Musculoskeletal: Positive for arthralgias.  Allergic/Immunologic: Positive for environmental allergies.  Neurological: Positive for numbness.  All other systems reviewed and are negative.      Objective:   Physical Exam        Assessment & Plan:

## 2017-12-30 NOTE — Progress Notes (Signed)
Subjective:   Patient ID: Jenna Cox, female   DOB: 57 y.o.   MRN: [id number]   HPI Patient presents with significant bunion deformity left over right it is painful and presents with severe lesion with inflammation base of fifth metatarsal left is very sore when palpated.  Patient states this is been ongoing and getting worse and she is tried wider shoes and other modalities without relief of symptoms.  Patient does not smoke and likes to be active   Review of Systems  All other systems reviewed and are negative.       Objective:  Physical Exam  Constitutional: She appears well-developed and well-nourished.  Cardiovascular: Intact distal pulses.  Pulmonary/Chest: Effort normal.  Musculoskeletal: Normal range of motion.  Neurological: She is alert.  Skin: Skin is warm.  Nursing note and vitals reviewed.   Neurovascular status intact muscle strength is adequate range of motion within normal limits with patient noted to have inflammation pain around the first metatarsal head left with mild on the right with mild deviation of the hallux but good range of motion.  Has inflammatory lesion sub-fifth metatarsal left is very painful when pressed and makes walking difficult and has several other lesions on both feet that also are irritated at times     Assessment:  Structural bunion deformity left symptomatic with mild deformity right not symptomatic and inflammatory changes fifth MPJ base left over right with keratotic lesion formation     Plan:  H&P x-rays reviewed and today had a careful injection of the left fifth MPJ 3 mg Kenalog 5 mg Xylocaine deep debridement of lesions discussed structural bunion correction which she will do in June after the school year is completed.  She will reappoint for consult and is given tentative date for the end of June for structural [place] type osteotomy left  X-rays indicate significant structural bunion deformity left over right with deviation of  the hallux against the second toe

## 2017-12-30 NOTE — Patient Instructions (Signed)

## 2018-01-27 ENCOUNTER — Ambulatory Visit (INDEPENDENT_AMBULATORY_CARE_PROVIDER_SITE_OTHER): Payer: No Typology Code available for payment source | Admitting: Podiatry

## 2018-01-27 ENCOUNTER — Encounter: Payer: Self-pay | Admitting: Podiatry

## 2018-01-27 DIAGNOSIS — M2042 Other hammer toe(s) (acquired), left foot: Secondary | ICD-10-CM

## 2018-01-27 DIAGNOSIS — M2011 Hallux valgus (acquired), right foot: Secondary | ICD-10-CM | POA: Diagnosis not present

## 2018-01-27 DIAGNOSIS — M2012 Hallux valgus (acquired), left foot: Secondary | ICD-10-CM

## 2018-01-27 NOTE — Patient Instructions (Signed)
Pre-Operative Instructions  Congratulations, you have decided to take an important step towards improving your quality of life.  You can be assured that the doctors and staff at [facility] will be with you every step of the way.  Here are some important things you should know:  1. Plan to be at the surgery center/hospital at least 1 (one) hour prior to your scheduled time, unless otherwise directed by the surgical center/hospital staff.  You must have a responsible adult accompany you, remain during the surgery and drive you home.  Make sure you have directions to the surgical center/hospital to ensure you arrive on time. 2. If you are having surgery at [facility] or [facility] hospitals, you will need a copy of your medical history and physical form from your family physician within one month prior to the date of surgery. We will give you a form for your primary physician to complete.  3. We make every effort to accommodate the date you request for surgery.  However, there are times where surgery dates or times have to be moved.  We will contact you as soon as possible if a change in schedule is required.   4. No aspirin/ibuprofen for one week before surgery.  If you are on aspirin, any non-steroidal anti-inflammatory medications (Mobic, Aleve, Ibuprofen) should not be taken seven (7) days prior to your surgery.  You make take Tylenol for pain prior to surgery.  5. Medications - If you are taking daily heart and blood pressure medications, seizure, reflux, allergy, asthma, anxiety, pain or diabetes medications, make sure you notify the surgery center/hospital before the day of surgery so they can tell you which medications you should take or avoid the day of surgery. 6. No food or drink after midnight the night before surgery unless directed otherwise by surgical center/hospital staff. 7. No alcoholic beverages 24-hours prior to surgery.  No smoking 24-hours prior or 24-hours after  surgery. 8. Wear loose pants or shorts. They should be loose enough to fit over bandages, boots, and casts. 9. Don't wear slip-on shoes. Sneakers are preferred. 10. Bring your boot with you to the surgery center/hospital.  Also bring crutches or a walker if your physician has prescribed it for you.  If you do not have this equipment, it will be provided for you after surgery. 11. If you have not been contacted by the surgery center/hospital by the day before your surgery, call to confirm the date and time of your surgery. 12. Leave-time from work may vary depending on the type of surgery you have.  Appropriate arrangements should be made prior to surgery with your employer. 13. Prescriptions will be provided immediately following surgery by your doctor.  Fill these as soon as possible after surgery and take the medication as directed. Pain medications will not be refilled on weekends and must be approved by the doctor. 14. Remove nail polish on the operative foot and avoid getting pedicures prior to surgery. 15. Wash the night before surgery.  The night before surgery wash the foot and leg well with water and the antibacterial soap provided. Be sure to pay special attention to beneath the toenails and in between the toes.  Wash for at least three (3) minutes. Rinse thoroughly with water and dry well with a towel.  Perform this wash unless told not to do so by your physician.  Enclosed: 1 Ice pack (please put in freezer the night before surgery)   1 Hibiclens skin cleaner     Pre-op instructions  If you have any questions regarding the instructions, please do not hesitate to call our office.  [place]: [address], [place], [place] [address] -- [phone]  [place]: [address] [place], [place] [address] -- [phone]  [place]: [address]  [place], [place] [address] -- [phone]  [place]: [address], [address], [place], [place] [address] -- [phone]  Website:  [email or web address] 

## 2018-01-27 NOTE — Progress Notes (Signed)
Subjective:   Patient ID: Jenna Cox, female   DOB: 57 y.o.   MRN: [id number]   HPI Patient presents stating I am deftly ready to get my bunion fixed my trouble with my fifth toe with a bad lesion on his on the bottom right foot does quite a bit better   ROS      Objective:  Physical Exam  Neurovascular status intact with keratotic lesion sub-fifth metatarsal left with significant structural bunion deformity left is not responded to shoe gear modifications soaks and medication with severe keratotic lesion fifth digit left is painful     Assessment:  Improving on the plantar aspect of the foot with structural bunion deformity left and hammertoe deformity fifth left     Plan:  Reviewed condition at great length and discussed surgical correction and patient wants surgery and today I gave her consent form going over alternative treatments complications associated with bunion correction and hammertoe correction.  I explained procedure and risk and patient wants surgery and at this point after extensive review she signed consent form understanding all risks as outlined.  She understands total recovery.  Can take approximately 6 months for procedure such as this in today air fracture walker was dispensed with all instructions on usage.  Patient is scheduled for after her vacation in June and she is encouraged to call with any questions she may have

## 2018-03-01 ENCOUNTER — Telehealth: Payer: Self-pay | Admitting: *Deleted

## 2018-03-01 ENCOUNTER — Encounter: Payer: Self-pay | Admitting: Podiatry

## 2018-03-01 DIAGNOSIS — M2012 Hallux valgus (acquired), left foot: Secondary | ICD-10-CM

## 2018-03-01 DIAGNOSIS — M2042 Other hammer toe(s) (acquired), left foot: Secondary | ICD-10-CM | POA: Diagnosis not present

## 2018-03-01 NOTE — Telephone Encounter (Signed)
I spoke with pharmacist [facility], she states they need to put maximum daily amount as 3/day, since this is the 1st time Dr. Paulla Dolly has written pt for Percocet and they believe the other prescription is for Zofran 4mg . I okayed the maximum of 3/day for the Percocet and the other medication was Zofran.

## 2018-03-01 NOTE — Telephone Encounter (Signed)
Jenna Cox - [facility] states pt is in their center and the nurses have gone, pharmacy states they are unable to read pt's rx and a high number has been ordered.

## 2018-03-07 ENCOUNTER — Encounter: Payer: Self-pay | Admitting: Podiatry

## 2018-03-07 ENCOUNTER — Ambulatory Visit (INDEPENDENT_AMBULATORY_CARE_PROVIDER_SITE_OTHER): Payer: No Typology Code available for payment source

## 2018-03-07 ENCOUNTER — Ambulatory Visit (INDEPENDENT_AMBULATORY_CARE_PROVIDER_SITE_OTHER): Payer: No Typology Code available for payment source | Admitting: Podiatry

## 2018-03-07 VITALS — BP 130/78 | HR 84 | Temp 98.7°F

## 2018-03-07 DIAGNOSIS — M2042 Other hammer toe(s) (acquired), left foot: Secondary | ICD-10-CM

## 2018-03-08 NOTE — Progress Notes (Signed)
Subjective:   Patient ID: Jenna Cox, female   DOB: 57 y.o.   MRN: [id number]   HPI Patient states doing well with minimal discomfort and able to put pressure on my foot without significant pain   ROS      Objective:  Physical Exam  Neurovascular status intact negative Homans sign noted with first metatarsal fifth digit healing well wound edges well coapted and good alignment noted with mild edema noted upon removal of dressing     Assessment:  Doing well post foot surgery left     Plan:  H&P condition reviewed and went ahead and reapplied sterile dressing instructed on continued elevation compression immobilization and reappoint to recheck  X-ray indicates the osteotomy is healing well good alignment noted with good structural correction and fixation

## 2018-03-28 ENCOUNTER — Ambulatory Visit (INDEPENDENT_AMBULATORY_CARE_PROVIDER_SITE_OTHER): Payer: No Typology Code available for payment source

## 2018-03-28 ENCOUNTER — Ambulatory Visit (INDEPENDENT_AMBULATORY_CARE_PROVIDER_SITE_OTHER): Payer: No Typology Code available for payment source | Admitting: Podiatry

## 2018-03-28 DIAGNOSIS — M2012 Hallux valgus (acquired), left foot: Secondary | ICD-10-CM | POA: Diagnosis not present

## 2018-03-28 DIAGNOSIS — M2042 Other hammer toe(s) (acquired), left foot: Secondary | ICD-10-CM | POA: Diagnosis not present

## 2018-03-28 DIAGNOSIS — M2011 Hallux valgus (acquired), right foot: Secondary | ICD-10-CM | POA: Diagnosis not present

## 2018-03-28 NOTE — Progress Notes (Signed)
Subjective:   Patient ID: Jenna Cox, female   DOB: 57 y.o.   MRN: [id number]   HPI Patient states overall doing very well with minimal discomfort and here for suture removal and to have the foot looked at   ROS      Objective:  Physical Exam  Neurovascular status intact negative Homans sign was noted with patient's left foot healing well wound edges well coapted hallux in rectus position fifth digit and alignment with stitches intact     Assessment:  Doing well post osteotomy first metatarsal left and hammertoe repair fifth digit left     Plan:  H&P conditions reviewed and today I went ahead and I reviewed her x-rays and then instructed on range of motion exercises and compression utilizing a ankle compression stocking.  Continue immobilization with gradual return to soft shoe over the next couple weeks and reappoint for me to recheck 4 weeks or earlier if needed  X-ray indicates the osteotomy is healing well fixation in place joint congruence with no movement

## 2018-04-25 ENCOUNTER — Ambulatory Visit (INDEPENDENT_AMBULATORY_CARE_PROVIDER_SITE_OTHER): Payer: No Typology Code available for payment source

## 2018-04-25 ENCOUNTER — Ambulatory Visit (INDEPENDENT_AMBULATORY_CARE_PROVIDER_SITE_OTHER): Payer: No Typology Code available for payment source | Admitting: Podiatry

## 2018-04-25 ENCOUNTER — Encounter: Payer: Self-pay | Admitting: Podiatry

## 2018-04-25 DIAGNOSIS — M2042 Other hammer toe(s) (acquired), left foot: Secondary | ICD-10-CM

## 2018-04-27 NOTE — Progress Notes (Signed)
Subjective:   Patient ID: Jenna Cox, female   DOB: 57 y.o.   MRN: [id number]   HPI Patient states I am doing well overall but still getting occasional pain   ROS      Objective:  Physical Exam  Neurovascular status intact with patient's left foot healing well overall with good alignment mild swelling and good range of motion of the joint with stitches in place     Assessment:  Doing well post forefoot reconstruction left     Plan:  H&P condition reviewed and at this point I reviewed continued compression elevation immobilization and Advil as needed.  Patient be seen back 3 weeks or earlier if needed sterile dressing reapplied  X-ray indicates osteotomies healing well fixation in place joint congruous

## 2018-06-13 ENCOUNTER — Ambulatory Visit (INDEPENDENT_AMBULATORY_CARE_PROVIDER_SITE_OTHER): Payer: No Typology Code available for payment source

## 2018-06-13 ENCOUNTER — Encounter: Payer: Self-pay | Admitting: Podiatry

## 2018-06-13 ENCOUNTER — Ambulatory Visit (INDEPENDENT_AMBULATORY_CARE_PROVIDER_SITE_OTHER): Payer: No Typology Code available for payment source | Admitting: Podiatry

## 2018-06-13 DIAGNOSIS — M2042 Other hammer toe(s) (acquired), left foot: Secondary | ICD-10-CM | POA: Diagnosis not present

## 2018-06-13 DIAGNOSIS — M779 Enthesopathy, unspecified: Secondary | ICD-10-CM

## 2018-06-13 MED ORDER — TRIAMCINOLONE ACETONIDE 10 MG/ML IJ SUSP
10.0000 mg | Freq: Once | INTRAMUSCULAR | Status: AC
  Administered 2018-06-13: 10 mg

## 2018-06-15 NOTE — Progress Notes (Signed)
Subjective:   Patient ID: Jenna Cox, female   DOB: 57 y.o.   MRN: [id number]   HPI Patient states she seems to be improving but is still having discomfort in the left foot   ROS      Objective:  Physical Exam  Neurovascular status intact with continued discomfort plantar left foot that is improved but still has an area of acute tenderness     Assessment:  Plantar fasciitis left improving but still present     Plan:  Injected the plantar fascial left 3 mg Kenalog 5 mg Xylocaine advised on supportive shoe gear therapy and exercises and discussed orthotics if symptoms persist

## 2020-02-09 ENCOUNTER — Encounter: Payer: Self-pay | Admitting: Family Medicine

## 2020-03-01 ENCOUNTER — Encounter: Payer: Self-pay | Admitting: Family Medicine

## 2020-04-25 ENCOUNTER — Ambulatory Visit: Payer: No Typology Code available for payment source | Admitting: Family Medicine

## 2020-04-25 ENCOUNTER — Other Ambulatory Visit: Payer: Self-pay

## 2020-04-25 ENCOUNTER — Encounter: Payer: Self-pay | Admitting: Family Medicine

## 2020-04-25 VITALS — BP 130/86 | HR 75 | Temp 97.9°F | Ht 64.0 in | Wt 175.0 lb

## 2020-04-25 DIAGNOSIS — Z298 Encounter for other specified prophylactic measures: Secondary | ICD-10-CM

## 2020-04-25 DIAGNOSIS — Z7184 Encounter for health counseling related to travel: Secondary | ICD-10-CM | POA: Diagnosis not present

## 2020-04-25 MED ORDER — DOXYCYCLINE HYCLATE 100 MG PO TABS: 100.0000 mg | ORAL_TABLET | Freq: Every day | ORAL | 0 refills | Status: DC

## 2020-04-25 NOTE — Patient Instructions (Addendum)
  Doxycycline once per day - start 2 days before travel, continue for 4 weeks after you return. Stay safe and have a great trip!    If you have lab work done today you will be contacted with your lab results within the next 2 weeks.  If you have not heard from [place] then please contact us. The fastest way to get your results is to register for My Chart.   IF you received an x-ray today, you will receive an invoice from [facility]. Please contact [facility] at [phone] with questions or concerns regarding your invoice.   IF you received labwork today, you will receive an invoice from [facility]. Please contact [facility] at [phone] with questions or concerns regarding your invoice.   Our billing staff will not be able to assist you with questions regarding bills from these companies.  You will be contacted with the lab results as soon as they are available. The fastest way to get your results is to activate your My Chart account. Instructions are located on the last page of this paperwork. If you have not heard from [place] regarding the results in 2 weeks, please contact this office.

## 2020-04-25 NOTE — Progress Notes (Signed)
Subjective:  Patient ID: Jenna Cox, female    DOB: 1960-09-29  Age: 59 y.o. MRN: [id number]  CC:  Chief Complaint  Patient presents with  . leaving the country    pt is leaving the country and would like a Rx for ant-malirial medication. and any other antibiotic the provider would think would be nessiary for a trip to [place]    HPI Jenna Cox presents for   Plan for trip to [place]. Leaves next Wednesday, through October 15th. Will be in main city, [place], but some traveling to other areas. Last traveled in 2017.   Typhoid vaccine - vivotif on 02/19/2016.  Initial Hep B vaccine single dose in 2017 - not sure if subsequent dose, possibly through [facility] - she will check with them.   Immunization History  Administered Date(s) Administered  . Hepatitis A 07/31/1994, 08/21/2011  . Hepatitis B 09/08/2015  . IPV 08/25/1991, 07/04/1997, 04/01/2004  . MMR 07/31/1994  . PFIZER SARS-COV-2 Vaccination 02/09/2020, 03/01/2020  . Td 03/13/1985, 07/31/1994, 03/12/2004, 09/08/2015  . Tdap 08/21/2011  . Typhoid Live 04/01/2004  . Typhoid Parenteral 08/21/2011  . Yellow Fever 04/01/2004    History Patient Active Problem List   Diagnosis Date Noted  . Sickle cell trait ([place]) 11/09/2017  . Anemia 11/09/2017  . Colon polyps 10/24/2011  . Carpal tunnel syndrome 11/20/2009  . Urinary tract infection, recurrent 01/25/2006  . Follicular cyst 25/95/6387  . Severe dysplasia of cervix 01/11/2006  . Fibroids 01/11/2006  . S/P LEEP of cervix 01/11/2006  . Pelvic pain 01/11/2006  . H/O hysterectomy for benign disease 01/11/2006   Past Medical History:  Diagnosis Date  . Allergy    seasonal - runny nose, congesiton, itchy eyes  . Asthma   . Carpal tunnel syndrome, bilateral    s/p Rt surgery but recurrence  . Fibroids   . Follicle cyst   . H/O measles   . H/O mumps   . History of chicken pox   . Pelvic pain   . Recurrent UTI (urinary tract infection)   . Severe dysplasia  of cervix   . Sickle cell trait [facility])    Past Surgical History:  Procedure Laterality Date  . ABDOMINAL HYSTERECTOMY  1998  . CARPAL TUNNEL RELEASE Right 2001  . CESAREAN SECTION    . MYOMECTOMY    . TUBAL LIGATION     Allergies  Allergen Reactions  . Other   . Sulfa Antibiotics    Prior to Admission medications   Medication Sig Start Date End Date Taking? Authorizing Provider  Ascorbic Acid (VITAMIN C) 1000 MG tablet Take 1,000 mg by mouth daily.   Yes [provider]  Cholecalciferol (VITAMIN D PO) Take 1 tablet by mouth as needed.   Yes [provider]  Multiple Vitamin (MULTIVITAMIN) tablet Take 1 tablet by mouth daily.   Yes [provider]   Social History   Socioeconomic History  . Marital status: Married    Spouse name: Not on file  . Number of children: Not on file  . Years of education: Not on file  . Highest education level: Bachelor's degree (e.g., BA, AB, BS)  Occupational History  . Occupation: Pharmacist, hospital    Comment: middle school  Tobacco Use  . Smoking status: Never Smoker  . Smokeless tobacco: Never Used  Vaping Use  . Vaping Use: Never used  Substance and Sexual Activity  . Alcohol use: No  . Drug use: No  . Sexual activity: Yes  Birth control/protection: Surgical, Post-menopausal  Other Topics Concern  . Not on file  Social History Narrative  . Not on file   Social Determinants of Health   Financial Resource Strain:   . Difficulty of Paying Living Expenses: Not on file  Food Insecurity:   . Worried About Charity fundraiser in the Last Year: Not on file  . Ran Out of Food in the Last Year: Not on file  Transportation Needs:   . Lack of Transportation (Medical): Not on file  . Lack of Transportation (Non-Medical): Not on file  Physical Activity:   . Days of Exercise per Week: Not on file  . Minutes of Exercise per Session: Not on file  Stress:   . Feeling of Stress : Not on file  Social Connections:   .  Frequency of Communication with Friends and Family: Not on file  . Frequency of Social Gatherings with Friends and Family: Not on file  . Attends Religious Services: Not on file  . Active Member of Clubs or Organizations: Not on file  . Attends Archivist Meetings: Not on file  . Marital Status: Not on file  Intimate Partner Violence:   . Fear of Current or Ex-Partner: Not on file  . Emotionally Abused: Not on file  . Physically Abused: Not on file  . Sexually Abused: Not on file    Review of Systems Per HPI.   Objective:   Vitals:   04/25/20 1047 04/25/20 1054  BP: (!) 142/87 130/86  Pulse: 75   Temp: 97.9 F (36.6 C)   TempSrc: Temporal   SpO2: 98%   Weight: 175 lb (79.4 kg)   Height: 5' 4"  (1.626 m)      Physical Exam Vitals reviewed.  Constitutional:      Appearance: Normal appearance.  Cardiovascular:     Rate and Rhythm: Normal rate and regular rhythm.  Pulmonary:     Effort: Pulmonary effort is normal.     Breath sounds: Normal breath sounds.  Neurological:     Mental Status: She is alert.  Psychiatric:        Mood and Affect: Mood normal.        Assessment & Plan:  Jenna Cox is a 59 y.o. female . Need for malaria prophylaxis - Plan: doxycycline (VIBRA-TABS) 100 MG tablet  Counseling about travel - Plan: doxycycline (VIBRA-TABS) 100 MG tablet  Vaccine reviewed - she will check with other facility about Hep B vaccine. Doxycycline for malaria prophylaxis. UTD on typhoid from 2017. CDV for travel info.   No orders of the defined types were placed in this encounter.  Patient Instructions       If you have lab work done today you will be contacted with your lab results within the next 2 weeks.  If you have not heard from [place] then please contact us. The fastest way to get your results is to register for My Chart.   IF you received an x-ray today, you will receive an invoice from [facility]. Please contact [facility]  Radiology at [phone] with questions or concerns regarding your invoice.   IF you received labwork today, you will receive an invoice from [facility]. Please contact [facility] at [phone] with questions or concerns regarding your invoice.   Our billing staff will not be able to assist you with questions regarding bills from these companies.  You will be contacted with the lab results as soon as they are available. The fastest way to  get your results is to activate your My Chart account. Instructions are located on the last page of this paperwork. If you have not heard from [place] regarding the results in 2 weeks, please contact this office.         Signed, Merri Ray, MD [facility]

## 2020-06-04 ENCOUNTER — Ambulatory Visit: Payer: No Typology Code available for payment source

## 2020-07-01 ENCOUNTER — Ambulatory Visit: Payer: No Typology Code available for payment source | Admitting: Family Medicine

## 2020-07-04 ENCOUNTER — Ambulatory Visit: Payer: No Typology Code available for payment source | Admitting: Family Medicine

## 2020-07-05 ENCOUNTER — Ambulatory Visit: Payer: No Typology Code available for payment source

## 2020-07-08 ENCOUNTER — Encounter: Payer: No Typology Code available for payment source | Admitting: Family Medicine

## 2020-10-21 ENCOUNTER — Other Ambulatory Visit: Payer: Self-pay

## 2020-10-21 ENCOUNTER — Ambulatory Visit: Payer: No Typology Code available for payment source | Admitting: Family Medicine

## 2020-10-21 ENCOUNTER — Encounter: Payer: Self-pay | Admitting: Family Medicine

## 2020-10-21 VITALS — BP 140/82 | HR 82 | Temp 97.9°F | Ht 64.0 in | Wt 172.0 lb

## 2020-10-21 DIAGNOSIS — Z1211 Encounter for screening for malignant neoplasm of colon: Secondary | ICD-10-CM

## 2020-10-21 DIAGNOSIS — Z6829 Body mass index (BMI) 29.0-29.9, adult: Secondary | ICD-10-CM | POA: Diagnosis not present

## 2020-10-21 DIAGNOSIS — G479 Sleep disorder, unspecified: Secondary | ICD-10-CM

## 2020-10-21 NOTE — Patient Instructions (Addendum)
Find some form of exercise you enjoy, low intensity, with ultimate goal of 60min per day 5 days per week (18min per week). "start low, go slow"  Try list of items for next day instead of trying to think through issues at night. Exercise may also help with sleep and stress. Try to set aside time for yourself to "recharge the battery".   Remember to set reasonable goals initially and move up as things are going well.  Do not get discouraged if you are unable to make your specific goal that week.  Follow-up with me in 3 months for physical and we can discuss health goals further at that time.  Thank you for coming in today.  Insomnia Insomnia is a sleep disorder that makes it difficult to fall asleep or stay asleep. Insomnia can cause fatigue, low energy, difficulty concentrating, mood swings, and poor performance at work or school. There are three different ways to classify insomnia:  Difficulty falling asleep.  Difficulty staying asleep.  Waking up too early in the morning. Any type of insomnia can be long-term (chronic) or short-term (acute). Both are common. Short-term insomnia usually lasts for three months or less. Chronic insomnia occurs at least three times a week for longer than three months. What are the causes? Insomnia may be caused by another condition, situation, or substance, such as:  Anxiety.  Certain medicines.  Gastroesophageal reflux disease (GERD) or other gastrointestinal conditions.  Asthma or other breathing conditions.  Restless legs syndrome, sleep apnea, or other sleep disorders.  Chronic pain.  Menopause.  Stroke.  Abuse of alcohol, tobacco, or illegal drugs.  Mental health conditions, such as depression.  Caffeine.  Neurological disorders, such as Alzheimer's disease.  An overactive thyroid (hyperthyroidism). Sometimes, the cause of insomnia may not be known. What increases the risk? Risk factors for insomnia include:  Gender. Women are  affected more often than men.  Age. Insomnia is more common as you get older.  Stress.  Lack of exercise.  Irregular work schedule or working night shifts.  Traveling between different time zones.  Certain medical and mental health conditions. What are the signs or symptoms? If you have insomnia, the main symptom is having trouble falling asleep or having trouble staying asleep. This may lead to other symptoms, such as:  Feeling fatigued or having low energy.  Feeling nervous about going to sleep.  Not feeling rested in the morning.  Having trouble concentrating.  Feeling irritable, anxious, or depressed. How is this diagnosed? This condition may be diagnosed based on:  Your symptoms and medical history. Your health care provider may ask about: ? Your sleep habits. ? Any medical conditions you have. ? Your mental health.  A physical exam. How is this treated? Treatment for insomnia depends on the cause. Treatment may focus on treating an underlying condition that is causing insomnia. Treatment may also include:  Medicines to help you sleep.  Counseling or therapy.  Lifestyle adjustments to help you sleep better. Follow these instructions at home: Eating and drinking  Limit or avoid alcohol, caffeinated beverages, and cigarettes, especially close to bedtime. These can disrupt your sleep.  Do not eat a large meal or eat spicy foods right before bedtime. This can lead to digestive discomfort that can make it hard for you to sleep.   Sleep habits  Keep a sleep diary to help you and your health care provider figure out what could be causing your insomnia. Write down: ? When you sleep. ? When  you wake up during the night. ? How well you sleep. ? How rested you feel the next day. ? Any side effects of medicines you are taking. ? What you eat and drink.  Make your bedroom a dark, comfortable place where it is easy to fall asleep. ? Put up shades or blackout curtains  to block light from outside. ? Use a white noise machine to block noise. ? Keep the temperature cool.  Limit screen use before bedtime. This includes: ? Watching TV. ? Using your smartphone, tablet, or computer.  Stick to a routine that includes going to bed and waking up at the same times every day and night. This can help you fall asleep faster. Consider making a quiet activity, such as reading, part of your nighttime routine.  Try to avoid taking naps during the day so that you sleep better at night.  Get out of bed if you are still awake after 15 minutes of trying to sleep. Keep the lights down, but try reading or doing a quiet activity. When you feel sleepy, go back to bed.   General instructions  Take over-the-counter and prescription medicines only as told by your health care provider.  Exercise regularly, as told by your health care provider. Avoid exercise starting several hours before bedtime.  Use relaxation techniques to manage stress. Ask your health care provider to suggest some techniques that may work well for you. These may include: ? Breathing exercises. ? Routines to release muscle tension. ? Visualizing peaceful scenes.  Make sure that you drive carefully. Avoid driving if you feel very sleepy.  Keep all follow-up visits as told by your health care provider. This is important. Contact a health care provider if:  You are tired throughout the day.  You have trouble in your daily routine due to sleepiness.  You continue to have sleep problems, or your sleep problems get worse. Get help right away if:  You have serious thoughts about hurting yourself or someone else. If you ever feel like you may hurt yourself or others, or have thoughts about taking your own life, get help right away. You can go to your nearest emergency department or call:  Your local emergency services (911 in the [place]  A suicide crisis helpline, such as the [place] at 413-832-6779. This is open 24 hours a day. Summary  Insomnia is a sleep disorder that makes it difficult to fall asleep or stay asleep.  Insomnia can be long-term (chronic) or short-term (acute).  Treatment for insomnia depends on the cause. Treatment may focus on treating an underlying condition that is causing insomnia.  Keep a sleep diary to help you and your health care provider figure out what could be causing your insomnia. This information is not intended to replace advice given to you by your health care provider. Make sure you discuss any questions you have with your health care provider. Document Revised: 07/04/2020 Document Reviewed: 07/04/2020 Elsevier Patient Education  2021 [employer].    If you have lab work done today you will be contacted with your lab results within the next 2 weeks.  If you have not heard from [place] then please contact us. The fastest way to get your results is to register for My Chart.   IF you received an x-ray today, you will receive an invoice from [facility]. Please contact [facility] at [phone] with questions or concerns regarding your invoice.   IF you received labwork today,  you will receive an invoice from [facility]. Please contact [facility] at [phone] with questions or concerns regarding your invoice.   Our billing staff will not be able to assist you with questions regarding bills from these companies.  You will be contacted with the lab results as soon as they are available. The fastest way to get your results is to activate your My Chart account. Instructions are located on the last page of this paperwork. If you have not heard from [place] regarding the results in 2 weeks, please contact this office.

## 2020-10-21 NOTE — Progress Notes (Signed)
Subjective:  Patient ID: Jenna Cox, female    DOB: Feb 01, 1961  Age: 60 y.o. MRN: [id number]  CC:  Chief Complaint  Patient presents with  . Transitions Of Care    Pt reports she feels fine with no complaints. Pt isn't currently fasting.    HPI Jenna Cox presents for   New patient to transfer of care.  I did see her in August prior to travel, discussion of malaria prophylaxis at that time. Back to [place] Jan 29th, then [place] last week. Good trip.   Only concern today is weight and stress mgt:  Overweight: Body mass index is 29.52 kg/m. Diet is going well, exercise and sleep are the challenge.  Exercise - does well for awhile, then life gets in the way. Walking, walking videos, African dance. Consistency is the challenge.  Ran track in high school.   Insomnia/sleep issue: Mind races at bedtime at times, planning in mind. Ok to get to sleep. Most nights bedtime 12a-1a, alarm at 6am. Some nights less. 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 Working on timing and efficiency, and planning time for herself.   HM:  Considering covid booster.  Colonoscopy - due last one in [facility]. Benign polyps. Request local - [place].   History Patient Active Problem List   Diagnosis Date Noted  . Sickle cell trait ([place]) 11/09/2017  . Anemia 11/09/2017  . Colon polyps 10/24/2011  . Carpal tunnel syndrome 11/20/2009  . Urinary tract infection, recurrent 01/25/2006  . Follicular cyst 82/50/5397  . Severe dysplasia of cervix 01/11/2006  . Fibroids 01/11/2006  . S/P LEEP of cervix 01/11/2006  . Pelvic pain 01/11/2006  . H/O hysterectomy for benign disease 01/11/2006   Past Medical History:  Diagnosis Date  . Allergy    seasonal - runny nose, congesiton, itchy eyes  . Asthma   . Asthma    [employer]rees[employer] 10/18/2020  . Carpal tunnel syndrome, bilateral    s/p Rt surgery but recurrence  . Fibroids   . Follicle cyst   . H/O measles   . H/O mumps   . History of chicken  pox   . Pelvic pain   . Recurrent UTI (urinary tract infection)   . Severe dysplasia of cervix   . Sickle cell trait [facility])    Past Surgical History:  Procedure Laterality Date  . ABDOMINAL HYSTERECTOMY  1998  . CARPAL TUNNEL RELEASE Right 2001  . CESAREAN SECTION    . MYOMECTOMY    . TUBAL LIGATION     Allergies  Allergen Reactions  . Other   . Sulfa Antibiotics    Prior to Admission medications   Medication Sig Start Date End Date Taking? Authorizing Provider  Cholecalciferol (VITAMIN D PO) Take 1 tablet by mouth as needed.   Yes [provider]  doxycycline (VIBRA-TABS) 100 MG tablet Take 1 tablet (100 mg total) by mouth daily. 04/25/20  Yes Wendie Agreste, MD  Multiple Vitamin (MULTIVITAMIN) tablet Take 1 tablet by mouth daily.   Yes [provider]  Ascorbic Acid (VITAMIN C) 1000 MG tablet Take 1,000 mg by mouth daily. Patient not taking: Reported on 10/21/2020    [provider]   Social History   Socioeconomic History  . Marital status: Married    Spouse name: Not on file  . Number of children: Not on file  . Years of education: Not on file  . Highest education level: Bachelor's degree (e.g., BA, AB, BS)  Occupational History  .  Occupation: Pharmacist, hospital    Comment: middle school  Tobacco Use  . Smoking status: Never Smoker  . Smokeless tobacco: Never Used  Vaping Use  . Vaping Use: Never used  Substance and Sexual Activity  . Alcohol use: No  . Drug use: No  . Sexual activity: Yes    Birth control/protection: Surgical, Post-menopausal  Other Topics Concern  . Not on file  Social History Narrative  . Not on file   Social Determinants of Health   Financial Resource Strain: Not on file  Food Insecurity: Not on file  Transportation Needs: Not on file  Physical Activity: Not on file  Stress: Not on file  Social Connections: Not on file  Intimate Partner Violence: Not on file    Review of Systems  Per HPI Objective:   Vitals:    10/21/20 1519 10/21/20 1527  BP: (!) 144/82 140/82  Pulse: 82   Temp: 97.9 F (36.6 C)   TempSrc: Temporal   SpO2: 100%   Weight: 172 lb (78 kg)   Height: 5\' 4"  (1.626 m)      Physical Exam Vitals reviewed.  Constitutional:      Appearance: She is well-developed and well-nourished.  HENT:     Head: Normocephalic and atraumatic.  Eyes:     Extraocular Movements: EOM normal.     Conjunctiva/sclera: Conjunctivae normal.     Pupils: Pupils are equal, round, and reactive to light.  Neck:     Vascular: No carotid bruit.  Cardiovascular:     Rate and Rhythm: Normal rate and regular rhythm.     Pulses: Intact distal pulses.     Heart sounds: Normal heart sounds.  Pulmonary:     Effort: Pulmonary effort is normal.     Breath sounds: Normal breath sounds.  Abdominal:     Palpations: Abdomen is soft. There is no pulsatile mass.     Tenderness: There is no abdominal tenderness.  Skin:    General: Skin is warm and dry.  Neurological:     Mental Status: She is alert and oriented to person, place, and time.  Psychiatric:        Mood and Affect: Mood and affect normal.        Behavior: Behavior normal.     55minutes spent during visit, greater than 50% counseling and assimilation of information, chart review, and discussion of plan.    Assessment & Plan:  Jenna Cox is a 60 y.o. female . Sleep difficulties  -Sleep hygiene discussed.  Handout given.  Exercise also discussed recheck 3 months for physical and update on symptoms, sooner if needed.  Special screening for malignant neoplasms, colon - Plan: Ambulatory referral to Gastroenterology for colonoscopy.  BMI 29.0-29.9,adult  -Exercise discussed with FITT possible, attainable goals.  66-month follow-up planned.  No orders of the defined types were placed in this encounter.  Patient Instructions    Find some form of exercise you enjoy, low intensity, with ultimate goal of 29min per day 5 days per week (140min  per week). "start low, go slow"  Try list of items for next day instead of trying to think through issues at night. Exercise may also help with sleep and stress. Try to set aside time for yourself to "recharge the battery".   Remember to set reasonable goals initially and move up as things are going well.  Do not get discouraged if you are unable to make your specific goal that week.  Follow-up with me in 3 months  for physical and we can discuss health goals further at that time.  Thank you for coming in today.  Insomnia Insomnia is a sleep disorder that makes it difficult to fall asleep or stay asleep. Insomnia can cause fatigue, low energy, difficulty concentrating, mood swings, and poor performance at work or school. There are three different ways to classify insomnia:  Difficulty falling asleep.  Difficulty staying asleep.  Waking up too early in the morning. Any type of insomnia can be long-term (chronic) or short-term (acute). Both are common. Short-term insomnia usually lasts for three months or less. Chronic insomnia occurs at least three times a week for longer than three months. What are the causes? Insomnia may be caused by another condition, situation, or substance, such as:  Anxiety.  Certain medicines.  Gastroesophageal reflux disease (GERD) or other gastrointestinal conditions.  Asthma or other breathing conditions.  Restless legs syndrome, sleep apnea, or other sleep disorders.  Chronic pain.  Menopause.  Stroke.  Abuse of alcohol, tobacco, or illegal drugs.  Mental health conditions, such as depression.  Caffeine.  Neurological disorders, such as Alzheimer's disease.  An overactive thyroid (hyperthyroidism). Sometimes, the cause of insomnia may not be known. What increases the risk? Risk factors for insomnia include:  Gender. Women are affected more often than men.  Age. Insomnia is more common as you get older.  Stress.  Lack of  exercise.  Irregular work schedule or working night shifts.  Traveling between different time zones.  Certain medical and mental health conditions. What are the signs or symptoms? If you have insomnia, the main symptom is having trouble falling asleep or having trouble staying asleep. This may lead to other symptoms, such as:  Feeling fatigued or having low energy.  Feeling nervous about going to sleep.  Not feeling rested in the morning.  Having trouble concentrating.  Feeling irritable, anxious, or depressed. How is this diagnosed? This condition may be diagnosed based on:  Your symptoms and medical history. Your health care provider may ask about: ? Your sleep habits. ? Any medical conditions you have. ? Your mental health.  A physical exam. How is this treated? Treatment for insomnia depends on the cause. Treatment may focus on treating an underlying condition that is causing insomnia. Treatment may also include:  Medicines to help you sleep.  Counseling or therapy.  Lifestyle adjustments to help you sleep better. Follow these instructions at home: Eating and drinking  Limit or avoid alcohol, caffeinated beverages, and cigarettes, especially close to bedtime. These can disrupt your sleep.  Do not eat a large meal or eat spicy foods right before bedtime. This can lead to digestive discomfort that can make it hard for you to sleep.   Sleep habits  Keep a sleep diary to help you and your health care provider figure out what could be causing your insomnia. Write down: ? When you sleep. ? When you wake up during the night. ? How well you sleep. ? How rested you feel the next day. ? Any side effects of medicines you are taking. ? What you eat and drink.  Make your bedroom a dark, comfortable place where it is easy to fall asleep. ? Put up shades or blackout curtains to block light from outside. ? Use a white noise machine to block noise. ? Keep the temperature  cool.  Limit screen use before bedtime. This includes: ? Watching TV. ? Using your smartphone, tablet, or computer.  Stick to a routine that includes going  to bed and waking up at the same times every day and night. This can help you fall asleep faster. Consider making a quiet activity, such as reading, part of your nighttime routine.  Try to avoid taking naps during the day so that you sleep better at night.  Get out of bed if you are still awake after 15 minutes of trying to sleep. Keep the lights down, but try reading or doing a quiet activity. When you feel sleepy, go back to bed.   General instructions  Take over-the-counter and prescription medicines only as told by your health care provider.  Exercise regularly, as told by your health care provider. Avoid exercise starting several hours before bedtime.  Use relaxation techniques to manage stress. Ask your health care provider to suggest some techniques that may work well for you. These may include: ? Breathing exercises. ? Routines to release muscle tension. ? Visualizing peaceful scenes.  Make sure that you drive carefully. Avoid driving if you feel very sleepy.  Keep all follow-up visits as told by your health care provider. This is important. Contact a health care provider if:  You are tired throughout the day.  You have trouble in your daily routine due to sleepiness.  You continue to have sleep problems, or your sleep problems get worse. Get help right away if:  You have serious thoughts about hurting yourself or someone else. If you ever feel like you may hurt yourself or others, or have thoughts about taking your own life, get help right away. You can go to your nearest emergency department or call:  Your local emergency services (911 in the [place]  A suicide crisis helpline, such as the [facility] at 718-386-5046. This is open 24 hours a day. Summary  Insomnia is a sleep disorder  that makes it difficult to fall asleep or stay asleep.  Insomnia can be long-term (chronic) or short-term (acute).  Treatment for insomnia depends on the cause. Treatment may focus on treating an underlying condition that is causing insomnia.  Keep a sleep diary to help you and your health care provider figure out what could be causing your insomnia. This information is not intended to replace advice given to you by your health care provider. Make sure you discuss any questions you have with your health care provider. Document Revised: 07/04/2020 Document Reviewed: 07/04/2020 Elsevier Patient Education  2021 [employer].    If you have lab work done today you will be contacted with your lab results within the next 2 weeks.  If you have not heard from [place] then please contact us. The fastest way to get your results is to register for My Chart.   IF you received an x-ray today, you will receive an invoice from [facility]. Please contact [facility] at [phone] with questions or concerns regarding your invoice.   IF you received labwork today, you will receive an invoice from [facility]. Please contact [facility] at [phone] with questions or concerns regarding your invoice.   Our billing staff will not be able to assist you with questions regarding bills from these companies.  You will be contacted with the lab results as soon as they are available. The fastest way to get your results is to activate your My Chart account. Instructions are located on the last page of this paperwork. If you have not heard from [place] regarding the results in 2 weeks, please contact this office.  Signed, Merri Ray, MD [facility]

## 2020-12-16 ENCOUNTER — Other Ambulatory Visit: Payer: Self-pay

## 2020-12-16 ENCOUNTER — Ambulatory Visit (AMBULATORY_SURGERY_CENTER): Payer: No Typology Code available for payment source

## 2020-12-16 VITALS — Ht 64.0 in | Wt 172.0 lb

## 2020-12-16 DIAGNOSIS — Z1211 Encounter for screening for malignant neoplasm of colon: Secondary | ICD-10-CM

## 2020-12-16 MED ORDER — SUTAB 1479-225-188 MG PO TABS: 12.0000 | ORAL_TABLET | ORAL | 0 refills | Status: DC

## 2020-12-16 NOTE — Progress Notes (Signed)
Pt verified name, DOB, address and insurance during PV today.   Pt mailed instruction packet copy of consent form to read and not return, and instructions. [employer] coupon mailed in packet. PV completed over the phone. Pt encouraged to call with questions or issues.   No allergies to soy or egg Pt is not on blood thinners or diet pills Denies issues with sedation/intubation Denies atrial flutter/fib Denies constipation   Pt is aware of Covid safety and care partner requirements.

## 2020-12-26 ENCOUNTER — Encounter: Payer: Self-pay | Admitting: Gastroenterology

## 2020-12-30 ENCOUNTER — Ambulatory Visit (AMBULATORY_SURGERY_CENTER): Payer: No Typology Code available for payment source | Admitting: Gastroenterology

## 2020-12-30 ENCOUNTER — Encounter: Payer: Self-pay | Admitting: Gastroenterology

## 2020-12-30 ENCOUNTER — Other Ambulatory Visit: Payer: Self-pay

## 2020-12-30 VITALS — BP 149/74 | HR 63 | Temp 98.0°F | Resp 12 | Ht 64.0 in | Wt 172.0 lb

## 2020-12-30 DIAGNOSIS — D123 Benign neoplasm of transverse colon: Secondary | ICD-10-CM

## 2020-12-30 DIAGNOSIS — Z1211 Encounter for screening for malignant neoplasm of colon: Secondary | ICD-10-CM

## 2020-12-30 DIAGNOSIS — D128 Benign neoplasm of rectum: Secondary | ICD-10-CM

## 2020-12-30 DIAGNOSIS — K640 First degree hemorrhoids: Secondary | ICD-10-CM

## 2020-12-30 DIAGNOSIS — K621 Rectal polyp: Secondary | ICD-10-CM

## 2020-12-30 DIAGNOSIS — K5730 Diverticulosis of large intestine without perforation or abscess without bleeding: Secondary | ICD-10-CM

## 2020-12-30 MED ORDER — SODIUM CHLORIDE 0.9 % IV SOLN: 500.0000 mL | INTRAVENOUS | Status: DC

## 2020-12-30 NOTE — Op Note (Signed)
[facility] Patient Name: Jenna Cox Procedure Date: 12/30/2020 1:26 PM MRN: [id number] Endoscopist: Gerrit Heck , MD Age: 60 Referring MD:  Date of Birth: 1961/04/30 Gender: Female Account #: [id number] Procedure:                Colonoscopy Indications:              Screening for colorectal malignant neoplasm (last                            colonoscopy was more than 10 years ago) Medicines:                Monitored Anesthesia Care Procedure:                Pre-Anesthesia Assessment:                           - Prior to the procedure, a History and Physical                            was performed, and patient medications and                            allergies were reviewed. The patient's tolerance of                            previous anesthesia was also reviewed. The risks                            and benefits of the procedure and the sedation                            options and risks were discussed with the patient.                            All questions were answered, and informed consent                            was obtained. Prior Anticoagulants: The patient has                            taken no previous anticoagulant or antiplatelet                            agents. ASA Grade Assessment: II - A patient with                            mild systemic disease. After reviewing the risks                            and benefits, the patient was deemed in                            satisfactory condition to undergo the procedure.  After obtaining informed consent, the colonoscope                            was passed under direct vision. Throughout the                            procedure, the patient's blood pressure, pulse, and                            oxygen saturations were monitored continuously. The                            Olympus [id number]) Colonoscope was                            introduced through the  anus and advanced to the the                            cecum, identified by the appendiceal orifice. The                            colonoscopy was performed without difficulty. The                            patient tolerated the procedure well. The quality                            of the bowel preparation was good. The ileocecal                            valve, appendiceal orifice, and rectum were                            photographed. Scope In: 1:30:31 PM Scope Out: 1:54:08 PM Scope Withdrawal Time: 0 hours 15 minutes 26 seconds  Total Procedure Duration: 0 hours 23 minutes 37 seconds  Findings:                 The perianal and digital rectal examinations were                            normal.                           A 5 mm polyp was found in the transverse colon. The                            polyp was sessile. The polyp was removed with a                            cold snare. Resection and retrieval were complete.                            Estimated blood loss was minimal.  A few sessile polyps were found in the rectum and                            recto-sigmoid colon. The polyps were 1 to 2 mm in                            size. Several of these polyps were removed with a                            cold snare for histologic representative                            evaluation. Resection and retrieval were complete.                            Estimated blood loss was minimal.                           Multiple small and large-mouthed diverticula were                            found in the sigmoid colon, descending colon and                            ascending colon.                           Non-bleeding internal hemorrhoids were found during                            retroflexion. The hemorrhoids were small.                           The sigmoid colon was moderately tortuous. Complications:            No immediate complications. Estimated  Blood Loss:     Estimated blood loss was minimal. Impression:               - One 5 mm polyp in the transverse colon, removed                            with a cold snare. Resected and retrieved.                           - A few 1 to 2 mm polyps in the rectum and at the                            recto-sigmoid colon, removed with a cold snare.                            Resected and retrieved.                           - Diverticulosis in the sigmoid colon, in the  descending colon and in the ascending colon.                           - Non-bleeding internal hemorrhoids.                           - Tortuous colon. Recommendation:           - Patient has a contact number available for                            emergencies. The signs and symptoms of potential                            delayed complications were discussed with the                            patient. Return to normal activities tomorrow.                            Written discharge instructions were provided to the                            patient.                           - Resume previous diet.                           - Continue present medications.                           - Await pathology results.                           - Repeat colonoscopy for surveillance based on                            pathology results.                           - Return to GI office PRN.                           - Use fiber, for example Citrucel, Fibercon, Konsyl                            or Metamucil. Gerrit Heck, MD 12/30/2020 1:59:00 PM

## 2020-12-30 NOTE — Patient Instructions (Signed)
Handout given:  Polyps, Hemorrhoids, Diverticulosis Resume previous diet Continue current medications Await pathology results YOU HAD AN ENDOSCOPIC PROCEDURE TODAY AT THE [facility]:   Refer to the procedure report that was given to you for any specific questions about what was found during the examination.  If the procedure report does not answer your questions, please call your gastroenterologist to clarify.  If you requested that your care partner not be given the details of your procedure findings, then the procedure report has been included in a sealed envelope for you to review at your convenience later.  YOU SHOULD EXPECT: Some feelings of bloating in the abdomen. Passage of more gas than usual.  Walking can help get rid of the air that was put into your GI tract during the procedure and reduce the bloating. If you had a lower endoscopy (such as a colonoscopy or flexible sigmoidoscopy) you may notice spotting of blood in your stool or on the toilet paper. If you underwent a bowel prep for your procedure, you may not have a normal bowel movement for a few days.  Please Note:  You might notice some irritation and congestion in your nose or some drainage.  This is from the oxygen used during your procedure.  There is no need for concern and it should clear up in a day or so.  SYMPTOMS TO REPORT IMMEDIATELY:   Following lower endoscopy (colonoscopy or flexible sigmoidoscopy):  Excessive amounts of blood in the stool  Significant tenderness or worsening of abdominal pains  Swelling of the abdomen that is new, acute  Fever of 100F or higher  For urgent or emergent issues, a gastroenterologist can be reached at any hour by calling [phone]. Do not use MyChart messaging for urgent concerns.    DIET:  We do recommend a small meal at first, but then you may proceed to your regular diet.  Drink plenty of fluids but you should avoid alcoholic beverages for 24  hours.  ACTIVITY:  You should plan to take it easy for the rest of today and you should NOT DRIVE or use heavy machinery until tomorrow (because of the sedation medicines used during the test).    FOLLOW UP: Our staff will call the number listed on your records 48-72 hours following your procedure to check on you and address any questions or concerns that you may have regarding the information given to you following your procedure. If we do not reach you, we will leave a message.  We will attempt to reach you two times.  During this call, we will ask if you have developed any symptoms of COVID 19. If you develop any symptoms (ie: fever, flu-like symptoms, shortness of breath, cough etc.) before then, please call [phone].  If you test positive for Covid 19 in the 2 weeks post procedure, please call and report this information to us.    If any biopsies were taken you will be contacted by phone or by letter within the next 1-3 weeks.  Please call us at [phone] if you have not heard about the biopsies in 3 weeks.   SIGNATURES/CONFIDENTIALITY: You and/or your care partner have signed paperwork which will be entered into your electronic medical record.  These signatures attest to the fact that that the information above on your After Visit Summary has been reviewed and is understood.  Full responsibility of the confidentiality of this discharge information lies with you and/or your care-partner. 

## 2020-12-30 NOTE — Progress Notes (Signed)
Pt's states no medical or surgical changes since previsit or office visit. 

## 2020-12-30 NOTE — Progress Notes (Signed)
pt tolerated well. VSS. awake and to recovery. Report given to RN.  

## 2021-01-01 ENCOUNTER — Telehealth: Payer: Self-pay

## 2021-01-01 NOTE — Telephone Encounter (Signed)
  Follow up Call-  Call back number 12/30/2020  Post procedure Call Back phone  # [phone]  Permission to leave phone message Yes  Some recent data might be hidden     Patient questions:  Do you have a fever, pain , or abdominal swelling? No. Pain Score  0 *  Have you tolerated food without any problems? Yes.    Have you been able to return to your normal activities? Yes.    Do you have any questions about your discharge instructions: Diet   No. Medications  No. Follow up visit  No.  Do you have questions or concerns about your Care? No.  Actions: * If pain score is 4 or above: No action needed, pain <4.  1. Have you developed a fever since your procedure? No   2.   Have you had an respiratory symptoms (SOB or cough) since your procedure? No   3.   Have you tested positive for COVID 19 since your procedure? No   4.   Have you had any family members/close contacts diagnosed with the COVID 19 since your procedure?  No    If yes to any of these questions please route to Joylene John, RN and Joella Prince, RN

## 2021-01-15 ENCOUNTER — Telehealth: Payer: Self-pay

## 2021-01-15 DIAGNOSIS — Z1329 Encounter for screening for other suspected endocrine disorder: Secondary | ICD-10-CM

## 2021-01-15 DIAGNOSIS — Z130 Encounter for screening for diseases of the blood and blood-forming organs and certain disorders involving the immune mechanism: Secondary | ICD-10-CM

## 2021-01-15 DIAGNOSIS — Z131 Encounter for screening for diabetes mellitus: Secondary | ICD-10-CM

## 2021-01-15 DIAGNOSIS — Z0000 Encounter for general adult medical examination without abnormal findings: Secondary | ICD-10-CM

## 2021-01-15 DIAGNOSIS — Z13220 Encounter for screening for lipoid disorders: Secondary | ICD-10-CM

## 2021-01-15 NOTE — Telephone Encounter (Signed)
Patient states she has appt for CPE on 5/16 at 4pm.    In the notes it states patient will come in the morning for labs.  I do not see any labs entered.  Patient is requesting a call back as to what Delfino Lovett would like her to do.

## 2021-01-16 ENCOUNTER — Encounter: Payer: Self-pay | Admitting: Gastroenterology

## 2021-01-16 NOTE — Telephone Encounter (Signed)
Labs ordered.

## 2021-01-16 NOTE — Telephone Encounter (Signed)
Dr. Carlota Raspberry -   Think this was supposed to head your way  Thanks,  Denice Paradise

## 2021-01-20 ENCOUNTER — Encounter: Payer: Self-pay | Admitting: Family Medicine

## 2021-01-20 ENCOUNTER — Ambulatory Visit (INDEPENDENT_AMBULATORY_CARE_PROVIDER_SITE_OTHER): Payer: No Typology Code available for payment source | Admitting: Family Medicine

## 2021-01-20 ENCOUNTER — Other Ambulatory Visit: Payer: Self-pay

## 2021-01-20 VITALS — BP 132/76 | HR 69 | Temp 98.2°F | Resp 17 | Ht 64.0 in | Wt 177.6 lb

## 2021-01-20 DIAGNOSIS — Z130 Encounter for screening for diseases of the blood and blood-forming organs and certain disorders involving the immune mechanism: Secondary | ICD-10-CM

## 2021-01-20 DIAGNOSIS — Z0000 Encounter for general adult medical examination without abnormal findings: Secondary | ICD-10-CM | POA: Diagnosis not present

## 2021-01-20 DIAGNOSIS — Z1329 Encounter for screening for other suspected endocrine disorder: Secondary | ICD-10-CM

## 2021-01-20 DIAGNOSIS — Z131 Encounter for screening for diabetes mellitus: Secondary | ICD-10-CM

## 2021-01-20 DIAGNOSIS — H6121 Impacted cerumen, right ear: Secondary | ICD-10-CM

## 2021-01-20 DIAGNOSIS — Z13220 Encounter for screening for lipoid disorders: Secondary | ICD-10-CM

## 2021-01-20 NOTE — Progress Notes (Signed)
Subjective:  Patient ID: Jenna Cox, female    DOB: 03/24/1961  Age: 60 y.o. MRN: [id number]  CC:  Chief Complaint  Patient presents with  . Annual Exam    Pt doing well, no concerns today, lab work completed.     HPI Jenna Cox presents for   Annual exam.  No new health issues.  Has had some weight gain after initial losses. Timing of eating, late night meals. Has been working on improvements.  Exercise: walking daily as goal, now at 2-3 days per week. Goal of 140 # eventually.   Sleep has been improved. Discussed in February. Still some late nights to do schoolwork, but nap at times. Trying to get to bed earlier, working on efficiency and timing of   No Rx meds. Fasting labs today.   Depression screen [facility] 2/9 01/20/2021 10/21/2020 11/06/2017 10/26/2017 02/19/2016  Decreased Interest 0 0 0 0 0  Down, Depressed, Hopeless 0 0 0 0 0  PHQ - 2 Score 0 0 0 0 0   Cancer screening: Colonoscopy: 12/30/20 Mammogram - at [facility] recently.   Immunization History  Administered Date(s) Administered  . Hepatitis A 07/31/1994, 08/21/2011  . Hepatitis B 09/08/2015  . IPV 08/25/1991, 07/04/1997, 04/01/2004  . MMR 07/31/1994  . PFIZER(Purple Top)SARS-COV-2 Vaccination 02/09/2020, 03/01/2020  . Td 03/13/1985, 07/31/1994, 03/12/2004, 09/08/2015  . Tdap 08/21/2011  . Typhoid Live 04/01/2004  . Typhoid Parenteral 08/21/2011  . Yellow Fever 04/01/2004  covid booster - has not had. Considering prior to travel. Going to [place] in July. May need malaria prophlaxis. Considering mefloquine next time as weekly dosing.  Shingrix: has not had - she will think about it.     Hearing Screening   125Hz  250Hz  500Hz  1000Hz  2000Hz  3000Hz  4000Hz  6000Hz  8000Hz   Right ear:           Left ear:             Visual Acuity Screening   Right eye Left eye Both eyes  Without correction:     With correction: 20/20 20/20 20/20   optho - over 1 year.   Dental: usually every 6 months - may be due.    Excess ear wax at times, hearing ok. Popping at times. Plans to return for possible lavage.    History Patient Active Problem List   Diagnosis Date Noted  . Sickle cell trait ([facility]) 11/09/2017  . Anemia 11/09/2017  . Colon polyps 10/24/2011  . Carpal tunnel syndrome 11/20/2009  . Urinary tract infection, recurrent 01/25/2006  . Follicular cyst 37/90/2409  . Severe dysplasia of cervix 01/11/2006  . Fibroids 01/11/2006  . S/P LEEP of cervix 01/11/2006  . Pelvic pain 01/11/2006  . H/O hysterectomy for benign disease 01/11/2006   Past Medical History:  Diagnosis Date  . Allergy    seasonal - runny nose, congesiton, itchy eyes  . Asthma   . Asthma    [employer] 10/18/2020  . Carpal tunnel syndrome, bilateral    s/p Rt surgery but recurrence  . Fibroids   . Follicle cyst   . H/O measles   . H/O mumps   . History of chicken pox   . Pelvic pain   . Recurrent UTI (urinary tract infection)   . Severe dysplasia of cervix   . Sickle cell anemia (HCC)    sickle cell trait  . Sickle cell trait [facility])    Past Surgical History:  Procedure Laterality Date  . ABDOMINAL HYSTERECTOMY  1998  . CARPAL  TUNNEL RELEASE Right 2001  . CESAREAN SECTION    . COLONOSCOPY     about 10 yrs ago  . MYOMECTOMY    . TUBAL LIGATION     Allergies  Allergen Reactions  . Other   . Sulfa Antibiotics    Prior to Admission medications   Medication Sig Start Date End Date Taking? Authorizing Provider  Ascorbic Acid (VITAMIN C) 1000 MG tablet Take 1,000 mg by mouth daily.   Yes [provider]  Cholecalciferol (VITAMIN D PO) Take 1 tablet by mouth as needed.   Yes [provider]  Multiple Vitamin (MULTIVITAMIN) tablet Take 1 tablet by mouth daily.   Yes [provider]   Social History   Socioeconomic History  . Marital status: Married    Spouse name: Not on file  . Number of children: Not on file  . Years of education: Not on file  . Highest education level:  Bachelor's degree (e.g., BA, AB, BS)  Occupational History  . Occupation: Pharmacist, hospital    Comment: middle school  Tobacco Use  . Smoking status: Never Smoker  . Smokeless tobacco: Never Used  Vaping Use  . Vaping Use: Never used  Substance and Sexual Activity  . Alcohol use: No  . Drug use: No  . Sexual activity: Yes    Birth control/protection: Surgical, Post-menopausal  Other Topics Concern  . Not on file  Social History Narrative  . Not on file   Social Determinants of Health   Financial Resource Strain: Not on file  Food Insecurity: Not on file  Transportation Needs: Not on file  Physical Activity: Not on file  Stress: Not on file  Social Connections: Not on file  Intimate Partner Violence: Not on file    Review of Systems   Objective:   Vitals:   01/20/21 1617  BP: 132/76  Pulse: 69  Resp: 17  Temp: 98.2 F (36.8 C)  TempSrc: Temporal  SpO2: 99%  Weight: 177 lb 9.6 oz (80.6 kg)  Height: 5' 4"  (1.626 m)     Physical Exam Vitals reviewed.  Constitutional:      Appearance: She is well-developed.  HENT:     Head: Normocephalic and atraumatic.     Right Ear: External ear normal. There is impacted cerumen.     Left Ear: External ear normal.     Ears:     Comments: Moderate amount of cerumen on left, visualized TM appears normal.  Right canal with excessive cerumen, able to hear but unable to visualize TM. Eyes:     Conjunctiva/sclera: Conjunctivae normal.     Pupils: Pupils are equal, round, and reactive to light.  Neck:     Thyroid: No thyromegaly.  Cardiovascular:     Rate and Rhythm: Normal rate and regular rhythm.     Heart sounds: Normal heart sounds. No murmur heard.   Pulmonary:     Effort: Pulmonary effort is normal. No respiratory distress.     Breath sounds: Normal breath sounds. No wheezing.  Abdominal:     General: Bowel sounds are normal.     Palpations: Abdomen is soft.     Tenderness: There is no abdominal tenderness.   Musculoskeletal:        General: No tenderness. Normal range of motion.     Cervical back: Normal range of motion and neck supple.  Lymphadenopathy:     Cervical: No cervical adenopathy.  Skin:    General: Skin is warm and dry.  Findings: No rash.  Neurological:     Mental Status: She is alert and oriented to person, place, and time.  Psychiatric:        Behavior: Behavior normal.        Thought Content: Thought content normal.        Assessment & Plan:  Jenna Cox is a 60 y.o. female . Annual physical exam - Plan: Comprehensive metabolic panel  - -anticipatory guidance as below in AVS, screening labs above. Health maintenance items as above in HPI discussed/recommended as applicable.   -Shingles vaccine recommended, declined at this time.  COVID booster also recommended.  -Commended that changes to diet/exercise.  Goal weight as above.  Also commended on changes for improved sleep.  Screening for diabetes mellitus - Plan: Comprehensive metabolic panel, Hemoglobin A1c, CANCELED: Hemoglobin A1c, CANCELED: Comprehensive metabolic panel  Screening for thyroid disorder - Plan: TSH, CANCELED: TSH  Screening, anemia, deficiency, iron - Plan: CBC with Differential/Platelet, CANCELED: CBC  Screening for hyperlipidemia - Plan: Comprehensive metabolic panel, Lipid panel, CANCELED: Comprehensive metabolic panel, CANCELED: Lipid panel  Excessive cerumen in ear canal, right  -Option of over-the-counter Debrox discussed but she would like to return for cerumen disimpaction/lavage.  Plans to schedule separate visit.  Handout given.  Able to hear normally bilaterally at this time.  Episodic popping in the ears could be middle ear/eustachian tube dysfunction but will evaluate further next visit unless any worsening symptoms sooner.  No orders of the defined types were placed in this encounter.  Patient Instructions   I do recommend shingles vaccine and covid vaccine booster.    Follow up with ophthalmology for eye care appointment, and dentist if due.   Follow up for excess cerumen and we can perform a lavage.    Earwax Buildup, Adult The ears produce a substance called earwax that helps keep bacteria out of the ear and protects the skin in the ear canal. Occasionally, earwax can build up in the ear and cause discomfort or hearing loss. What are the causes? This condition is caused by a buildup of earwax. Ear canals are self-cleaning. Ear wax is made in the outer part of the ear canal and generally falls out in small amounts over time. When the self-cleaning mechanism is not working, earwax builds up and can cause decreased hearing and discomfort. Attempting to clean ears with cotton swabs can push the earwax deep into the ear canal and cause decreased hearing and pain. What increases the risk? This condition is more likely to develop in people who:  Clean their ears often with cotton swabs.  Pick at their ears.  Use earplugs or in-ear headphones often, or wear hearing aids. The following factors may also make you more likely to develop this condition:  Being female.  Being of older age.  Naturally producing more earwax.  Having narrow ear canals.  Having earwax that is overly thick or sticky.  Having excess hair in the ear canal.  Having eczema.  Being dehydrated. What are the signs or symptoms? Symptoms of this condition include:  Reduced or muffled hearing.  A feeling of fullness in the ear or feeling that the ear is plugged.  Fluid coming from the ear.  Ear pain or an itchy ear.  Ringing in the ear.  Coughing.  Balance problems.  An obvious piece of earwax that can be seen inside the ear canal. How is this diagnosed? This condition may be diagnosed based on:  Your symptoms.  Your medical history.  An ear exam. During the exam, your health care provider will look into your ear with an instrument called an otoscope. You may  have tests, including a hearing test. How is this treated? This condition may be treated by:  Using ear drops to soften the earwax.  Having the earwax removed by a health care provider. The health care provider may: ? Flush the ear with water. ? Use an instrument that has a loop on the end (curette). ? Use a suction device.  Having surgery to remove the wax buildup. This may be done in severe cases. Follow these instructions at home:  Take over-the-counter and prescription medicines only as told by your health care provider.  Do not put any objects, including cotton swabs, into your ear. You can clean the opening of your ear canal with a washcloth or facial tissue.  Follow instructions from your health care provider about cleaning your ears. Do not overclean your ears.  Drink enough fluid to keep your urine pale yellow. This will help to thin the earwax.  Keep all follow-up visits as told. If earwax builds up in your ears often or if you use hearing aids, consider seeing your health care provider for routine, preventive ear cleanings. Ask your health care provider how often you should schedule your cleanings.  If you have hearing aids, clean them according to instructions from the manufacturer and your health care provider.   Contact a health care provider if:  You have ear pain.  You develop a fever.  You have pus or other fluid coming from your ear.  You have hearing loss.  You have ringing in your ears that does not go away.  You feel like the room is spinning (vertigo).  Your symptoms do not improve with treatment. Get help right away if:  You have bleeding from the affected ear.  You have severe ear pain. Summary  Earwax can build up in the ear and cause discomfort or hearing loss.  The most common symptoms of this condition include reduced or muffled hearing, a feeling of fullness in the ear, or feeling that the ear is plugged.  This condition may be diagnosed  based on your symptoms, your medical history, and an ear exam.  This condition may be treated by using ear drops to soften the earwax or by having the earwax removed by a health care provider.  Do not put any objects, including cotton swabs, into your ear. You can clean the opening of your ear canal with a washcloth or facial tissue. This information is not intended to replace advice given to you by your health care provider. Make sure you discuss any questions you have with your health care provider. Document Revised: 12/12/2019 Document Reviewed: 12/12/2019 Elsevier Patient Education  [facility]  Get These Tests  Blood Pressure- Have your blood pressure checked by your healthcare provider at least once a year.  Normal blood pressure is 120/80.  Weight- Have your body mass index (BMI) calculated to screen for obesity.  BMI is a measure of body fat based on height and weight.  You can calculate your own BMI at [employer]  Cholesterol- Have your cholesterol checked every year.  Diabetes- Have your blood sugar checked every year if you have high blood pressure, high cholesterol, a family history of diabetes or if you are overweight.  Pap Test - Have a pap test every 1 to 5 years if you have been sexually  active.  If you are older than 65 and recent pap tests have been normal you may not need additional pap tests.  In addition, if you have had a hysterectomy  for benign disease additional pap tests are not necessary.  Mammogram-Yearly mammograms are essential for early detection of breast cancer  Screening for Colon Cancer- Colonoscopy starting at age 28. Screening may begin sooner depending on your family history and other health conditions.  Follow up colonoscopy as directed by your Gastroenterologist.  Screening for Osteoporosis- Screening begins at age 37 with bone density scanning, sooner if you are at higher risk for developing  Osteoporosis.  Get these medicines  Calcium with Vitamin D- Your body requires 1200-1500 mg of Calcium a day and [phone] IU of Vitamin D a day.  You can only absorb 500 mg of Calcium at a time therefore Calcium must be taken in 2 or 3 separate doses throughout the day.  Hormones- Hormone therapy has been associated with increased risk for certain cancers and heart disease.  Talk to your healthcare provider about if you need relief from menopausal symptoms.  Aspirin- Ask your healthcare provider about taking Aspirin to prevent Heart Disease and Stroke.  Get these Immuniztions  Flu shot- Every fall  Pneumonia shot- Once after the age of 58; if you are younger ask your healthcare provider if you need a pneumonia shot.  Tetanus- Every ten years.  Shingrix- Twice after the age of 52 to prevent shingles.  Take these steps  Don't smoke- Your healthcare provider can help you quit. For tips on how to quit, ask your healthcare provider or go to www.smokefree.gov or call 1-800 QUIT-NOW.  Be physically active- Exercise 5 days a week for a minimum of 30 minutes.  If you are not already physically active, start slow and gradually work up to 30 minutes of moderate physical activity.  Try walking, dancing, bike riding, swimming, etc.  Eat a healthy diet- Eat a variety of healthy foods such as fruits, vegetables, whole grains, low fat milk, low fat cheeses, yogurt, lean meats, chicken, fish, eggs, dried beans, tofu, etc.  For more information go to www.thenutritionsource.org  Dental visit- Brush and floss teeth twice daily; visit your dentist twice a year.  Eye exam- Visit your Optometrist or Ophthalmologist yearly.  Drink alcohol in moderation- Limit alcohol intake to one drink or less a day.  Never drink and drive.  Depression- Your emotional health is as important as your physical health.  If you're feeling down or losing interest in things you normally enjoy, please talk to your healthcare  provider.  Seat Belts- can save your life; always wear one  Smoke/Carbon Monoxide detectors- These detectors need to be installed on the appropriate level of your home.  Replace batteries at least once a year.  Violence- If anyone is threatening or hurting you, please tell your healthcare provider.  Living Will/ Health care power of attorney- Discuss with your healthcare provider and family.     Signed, Merri Ray, MD [facility]

## 2021-01-20 NOTE — Patient Instructions (Addendum)
I do recommend shingles vaccine and covid vaccine booster.   Follow up with ophthalmology for eye care appointment, and dentist if due.   Follow up for excess cerumen and we can perform a lavage.    Earwax Buildup, Adult The ears produce a substance called earwax that helps keep bacteria out of the ear and protects the skin in the ear canal. Occasionally, earwax can build up in the ear and cause discomfort or hearing loss. What are the causes? This condition is caused by a buildup of earwax. Ear canals are self-cleaning. Ear wax is made in the outer part of the ear canal and generally falls out in small amounts over time. When the self-cleaning mechanism is not working, earwax builds up and can cause decreased hearing and discomfort. Attempting to clean ears with cotton swabs can push the earwax deep into the ear canal and cause decreased hearing and pain. What increases the risk? This condition is more likely to develop in people who:  Clean their ears often with cotton swabs.  Pick at their ears.  Use earplugs or in-ear headphones often, or wear hearing aids. The following factors may also make you more likely to develop this condition:  Being female.  Being of older age.  Naturally producing more earwax.  Having narrow ear canals.  Having earwax that is overly thick or sticky.  Having excess hair in the ear canal.  Having eczema.  Being dehydrated. What are the signs or symptoms? Symptoms of this condition include:  Reduced or muffled hearing.  A feeling of fullness in the ear or feeling that the ear is plugged.  Fluid coming from the ear.  Ear pain or an itchy ear.  Ringing in the ear.  Coughing.  Balance problems.  An obvious piece of earwax that can be seen inside the ear canal. How is this diagnosed? This condition may be diagnosed based on:  Your symptoms.  Your medical history.  An ear exam. During the exam, your health care provider will look into  your ear with an instrument called an otoscope. You may have tests, including a hearing test. How is this treated? This condition may be treated by:  Using ear drops to soften the earwax.  Having the earwax removed by a health care provider. The health care provider may: ? Flush the ear with water. ? Use an instrument that has a loop on the end (curette). ? Use a suction device.  Having surgery to remove the wax buildup. This may be done in severe cases. Follow these instructions at home:  Take over-the-counter and prescription medicines only as told by your health care provider.  Do not put any objects, including cotton swabs, into your ear. You can clean the opening of your ear canal with a washcloth or facial tissue.  Follow instructions from your health care provider about cleaning your ears. Do not overclean your ears.  Drink enough fluid to keep your urine pale yellow. This will help to thin the earwax.  Keep all follow-up visits as told. If earwax builds up in your ears often or if you use hearing aids, consider seeing your health care provider for routine, preventive ear cleanings. Ask your health care provider how often you should schedule your cleanings.  If you have hearing aids, clean them according to instructions from the manufacturer and your health care provider.   Contact a health care provider if:  You have ear pain.  You develop a fever.  You have  pus or other fluid coming from your ear.  You have hearing loss.  You have ringing in your ears that does not go away.  You feel like the room is spinning (vertigo).  Your symptoms do not improve with treatment. Get help right away if:  You have bleeding from the affected ear.  You have severe ear pain. Summary  Earwax can build up in the ear and cause discomfort or hearing loss.  The most common symptoms of this condition include reduced or muffled hearing, a feeling of fullness in the ear, or feeling  that the ear is plugged.  This condition may be diagnosed based on your symptoms, your medical history, and an ear exam.  This condition may be treated by using ear drops to soften the earwax or by having the earwax removed by a health care provider.  Do not put any objects, including cotton swabs, into your ear. You can clean the opening of your ear canal with a washcloth or facial tissue. This information is not intended to replace advice given to you by your health care provider. Make sure you discuss any questions you have with your health care provider. Document Revised: 12/12/2019 Document Reviewed: 12/12/2019 Elsevier Patient Education  [place] [facility]  Get These Tests  Blood Pressure- Have your blood pressure checked by your healthcare provider at least once a year.  Normal blood pressure is 120/80.  Weight- Have your body mass index (BMI) calculated to screen for obesity.  BMI is a measure of body fat based on height and weight.  You can calculate your own BMI at [employer]  Cholesterol- Have your cholesterol checked every year.  Diabetes- Have your blood sugar checked every year if you have high blood pressure, high cholesterol, a family history of diabetes or if you are overweight.  Pap Test - Have a pap test every 1 to 5 years if you have been sexually active.  If you are older than 65 and recent pap tests have been normal you may not need additional pap tests.  In addition, if you have had a hysterectomy  for benign disease additional pap tests are not necessary.  Mammogram-Yearly mammograms are essential for early detection of breast cancer  Screening for Colon Cancer- Colonoscopy starting at age 87. Screening may begin sooner depending on your family history and other health conditions.  Follow up colonoscopy as directed by your Gastroenterologist.  Screening for Osteoporosis- Screening begins at age 69 with bone density scanning,  sooner if you are at higher risk for developing Osteoporosis.  Get these medicines  Calcium with Vitamin D- Your body requires 1200-1500 mg of Calcium a day and [phone] IU of Vitamin D a day.  You can only absorb 500 mg of Calcium at a time therefore Calcium must be taken in 2 or 3 separate doses throughout the day.  Hormones- Hormone therapy has been associated with increased risk for certain cancers and heart disease.  Talk to your healthcare provider about if you need relief from menopausal symptoms.  Aspirin- Ask your healthcare provider about taking Aspirin to prevent Heart Disease and Stroke.  Get these Immuniztions  Flu shot- Every fall  Pneumonia shot- Once after the age of 67; if you are younger ask your healthcare provider if you need a pneumonia shot.  Tetanus- Every ten years.  Shingrix- Twice after the age of 20 to prevent shingles.  Take these steps  Don't smoke- Your healthcare provider can help  you quit. For tips on how to quit, ask your healthcare provider or go to www.smokefree.gov or call 1-800 QUIT-NOW.  Be physically active- Exercise 5 days a week for a minimum of 30 minutes.  If you are not already physically active, start slow and gradually work up to 30 minutes of moderate physical activity.  Try walking, dancing, bike riding, swimming, etc.  Eat a healthy diet- Eat a variety of healthy foods such as fruits, vegetables, whole grains, low fat milk, low fat cheeses, yogurt, lean meats, chicken, fish, eggs, dried beans, tofu, etc.  For more information go to www.thenutritionsource.org  Dental visit- Brush and floss teeth twice daily; visit your dentist twice a year.  Eye exam- Visit your Optometrist or Ophthalmologist yearly.  Drink alcohol in moderation- Limit alcohol intake to one drink or less a day.  Never drink and drive.  Depression- Your emotional health is as important as your physical health.  If you're feeling down or losing interest in things you  normally enjoy, please talk to your healthcare provider.  Seat Belts- can save your life; always wear one  Smoke/Carbon Monoxide detectors- These detectors need to be installed on the appropriate level of your home.  Replace batteries at least once a year.  Violence- If anyone is threatening or hurting you, please tell your healthcare provider.  Living Will/ Health care power of attorney- Discuss with your healthcare provider and family.

## 2021-01-21 LAB — COMPREHENSIVE METABOLIC PANEL
AG Ratio: 1.1 (calc) (ref 1.0–2.5)
ALT: 16 U/L (ref 6–29)
AST: 20 U/L (ref 10–35)
Albumin: 3.8 g/dL (ref 3.6–5.1)
Alkaline phosphatase (APISO): 71 U/L (ref 37–153)
BUN: 15 mg/dL (ref 7–25)
CO2: 19 mmol/L — ABNORMAL LOW (ref 20–32)
Calcium: 9.1 mg/dL (ref 8.6–10.4)
Chloride: 108 mmol/L (ref 98–110)
Creat: 0.89 mg/dL (ref 0.50–0.99)
Globulin: 3.4 g/dL (calc) (ref 1.9–3.7)
Glucose, Bld: 71 mg/dL (ref 65–99)
Potassium: 3.7 mmol/L (ref 3.5–5.3)
Sodium: 145 mmol/L (ref 135–146)
Total Bilirubin: 0.3 mg/dL (ref 0.2–1.2)
Total Protein: 7.2 g/dL (ref 6.1–8.1)

## 2021-01-21 LAB — CBC WITH DIFFERENTIAL/PLATELET
Absolute Monocytes: 482 cells/uL (ref 200–950)
Basophils Absolute: 29 cells/uL (ref 0–200)
Basophils Relative: 0.4 %
Eosinophils Absolute: 274 cells/uL (ref 15–500)
Eosinophils Relative: 3.8 %
HCT: 32.4 % — ABNORMAL LOW (ref 35.0–45.0)
Hemoglobin: 10.7 g/dL — ABNORMAL LOW (ref 11.7–15.5)
Lymphs Abs: 1966 cells/uL (ref 850–3900)
MCH: 29.6 pg (ref 27.0–33.0)
MCHC: 33 g/dL (ref 32.0–36.0)
MCV: 89.8 fL (ref 80.0–100.0)
MPV: 11.9 fL (ref 7.5–12.5)
Monocytes Relative: 6.7 %
Neutro Abs: 4450 cells/uL (ref 1500–7800)
Neutrophils Relative %: 61.8 %
Platelets: 228 10*3/uL (ref 140–400)
RBC: 3.61 10*6/uL — ABNORMAL LOW (ref 3.80–5.10)
RDW: 13.4 % (ref 11.0–15.0)
Total Lymphocyte: 27.3 %
WBC: 7.2 10*3/uL (ref 3.8–10.8)

## 2021-01-21 LAB — HEMOGLOBIN A1C
Hgb A1c MFr Bld: 5.2 % of total Hgb (ref <5.7)
Mean Plasma Glucose: 103 mg/dL
eAG (mmol/L): 5.7 mmol/L

## 2021-01-21 LAB — TSH: TSH: 1.2 mIU/L (ref 0.40–4.50)

## 2021-01-21 LAB — LIPID PANEL
Cholesterol: 182 mg/dL (ref <200)
HDL: 41 mg/dL — ABNORMAL LOW (ref >=50)
LDL Cholesterol (Calc): 127 mg/dL (calc) — ABNORMAL HIGH
Non-HDL Cholesterol (Calc): 141 mg/dL (calc) — ABNORMAL HIGH (ref <130)
Total CHOL/HDL Ratio: 4.4 (calc) (ref <5.0)
Triglycerides: 52 mg/dL (ref <150)

## 2021-01-21 LAB — EXTRA SPECIMEN

## 2021-05-01 ENCOUNTER — Ambulatory Visit: Payer: No Typology Code available for payment source | Admitting: Family Medicine

## 2021-05-21 ENCOUNTER — Other Ambulatory Visit: Payer: Self-pay

## 2021-05-21 ENCOUNTER — Encounter: Payer: Self-pay | Admitting: Registered Nurse

## 2021-05-21 ENCOUNTER — Ambulatory Visit: Payer: No Typology Code available for payment source | Admitting: Registered Nurse

## 2021-05-21 VITALS — BP 135/80 | HR 81 | Temp 97.8°F | Resp 18 | Ht 64.0 in | Wt 176.6 lb

## 2021-05-21 DIAGNOSIS — H6122 Impacted cerumen, left ear: Secondary | ICD-10-CM | POA: Diagnosis not present

## 2021-05-21 DIAGNOSIS — L299 Pruritus, unspecified: Secondary | ICD-10-CM

## 2021-05-21 MED ORDER — HYDROCORTISONE-ACETIC ACID 1-2 % OT SOLN: 3.0000 [drp] | Freq: Three times a day (TID) | OTIC | 0 refills | Status: AC

## 2021-05-21 NOTE — Progress Notes (Signed)
Established Patient Office Visit  Subjective:  Patient ID: Jenna Cox, female    DOB: [birth year]  Age: 60 y.o. MRN: [id number]  CC:  Chief Complaint  Patient presents with   Cerumen Impaction    Patient states she has been noticing some wax that's been building up in her ears.    HPI Jenna Cox presents for cerumen impaction  Left Ongoing for a few weeks No pain or drainage but having fullness, some pressure, some muffled hearing, sensation of tinnitus. R ear is clear, symptom free. No other symptoms at this time  Has had impacted cerumen in past with ear lavage, has tolerated this well.   Past Medical History:  Diagnosis Date   Allergy    seasonal - runny nose, congesiton, itchy eyes   Asthma    Asthma    [employer] 10/18/2020   Carpal tunnel syndrome, bilateral    s/p Rt surgery but recurrence   Fibroids    Follicle cyst    H/O measles    H/O mumps    History of chicken pox    Pelvic pain    Recurrent UTI (urinary tract infection)    Severe dysplasia of cervix    Sickle cell anemia (HCC)    sickle cell trait   Sickle cell trait (HCC)     Past Surgical History:  Procedure Laterality Date   ABDOMINAL HYSTERECTOMY  1998   CARPAL TUNNEL RELEASE Right 2001   CESAREAN SECTION     COLONOSCOPY     about 10 yrs ago   MYOMECTOMY     TUBAL LIGATION      Family History  Problem Relation Age of Onset   Cancer Father        pancreatic   Diabetes Father    Hyperlipidemia Father    Hypertension Father    Stroke Father    Sickle cell trait Brother    Cancer Paternal Aunt    Colon cancer Neg Hx    Colon polyps Neg Hx    Esophageal cancer Neg Hx    Rectal cancer Neg Hx    Stomach cancer Neg Hx     Social History   Socioeconomic History   Marital status: Married    Spouse name: Not on file   Number of children: Not on file   Years of education: Not on file   Highest education level: Bachelor's degree (e.g., BA, AB, BS)  Occupational History    Occupation: Pharmacist, hospital    Comment: middle school  Tobacco Use   Smoking status: Never   Smokeless tobacco: Never  Vaping Use   Vaping Use: Never used  Substance and Sexual Activity   Alcohol use: No   Drug use: No   Sexual activity: Yes    Birth control/protection: Surgical, Post-menopausal  Other Topics Concern   Not on file  Social History Narrative   Not on file   Social Determinants of Health   Financial Resource Strain: Not on file  Food Insecurity: Not on file  Transportation Needs: Not on file  Physical Activity: Not on file  Stress: Not on file  Social Connections: Not on file  Intimate Partner Violence: Not on file    Outpatient Medications Prior to Visit  Medication Sig Dispense Refill   Ascorbic Acid (VITAMIN C) 1000 MG tablet Take 1,000 mg by mouth daily.     Cholecalciferol (VITAMIN D PO) Take 1 tablet by mouth as needed.     Multiple Vitamin (MULTIVITAMIN) tablet Take 1  tablet by mouth daily.     No facility-administered medications prior to visit.    Allergies  Allergen Reactions   Other    Sulfa Antibiotics     ROS Review of Systems  Constitutional: Negative.   HENT:  Positive for ear pain (pressure). Negative for ear discharge.   Eyes: Negative.   Respiratory: Negative.    Cardiovascular: Negative.   Gastrointestinal: Negative.   Genitourinary: Negative.   Musculoskeletal: Negative.   Skin: Negative.   Neurological: Negative.   Psychiatric/Behavioral: Negative.    All other systems reviewed and are negative.    Objective:    Physical Exam Vitals and nursing note reviewed.  Constitutional:      General: She is not in acute distress.    Appearance: Normal appearance. She is normal weight. She is not ill-appearing, toxic-appearing or diaphoretic.  HENT:     Right Ear: Tympanic membrane, ear canal and external ear normal. There is no impacted cerumen.     Left Ear: Tympanic membrane, ear canal and external ear normal. There is impacted  cerumen (after lavage TM visible, pearly gray).  Cardiovascular:     Rate and Rhythm: Normal rate and regular rhythm.     Heart sounds: Normal heart sounds. No murmur heard.   No friction rub. No gallop.  Pulmonary:     Effort: Pulmonary effort is normal. No respiratory distress.     Breath sounds: Normal breath sounds. No stridor. No wheezing, rhonchi or rales.  Chest:     Chest wall: No tenderness.  Skin:    General: Skin is warm and dry.  Neurological:     General: No focal deficit present.     Mental Status: She is alert and oriented to person, place, and time. Mental status is at baseline.  Psychiatric:        Mood and Affect: Mood normal.        Behavior: Behavior normal.        Thought Content: Thought content normal.        Judgment: Judgment normal.    BP 135/80   Pulse 81   Temp 97.8 F (36.6 C) (Temporal)   Resp 18   Ht '5\' 4"'$  (1.626 m)   Wt 176 lb 9.6 oz (80.1 kg)   SpO2 99%   BMI 30.31 kg/m  Wt Readings from Last 3 Encounters:  05/21/21 176 lb 9.6 oz (80.1 kg)  01/20/21 177 lb 9.6 oz (80.6 kg)  12/30/20 172 lb (78 kg)     There are no preventive care reminders to display for this patient.  There are no preventive care reminders to display for this patient.  Lab Results  Component Value Date   TSH 1.20 01/20/2021   Lab Results  Component Value Date   WBC 7.2 01/20/2021   HGB 10.7 (L) 01/20/2021   HCT 32.4 (L) 01/20/2021   MCV 89.8 01/20/2021   PLT 228 01/20/2021   Lab Results  Component Value Date   NA 145 01/20/2021   K 3.7 01/20/2021   CO2 19 (L) 01/20/2021   GLUCOSE 71 01/20/2021   BUN 15 01/20/2021   CREATININE 0.89 01/20/2021   BILITOT 0.3 01/20/2021   ALKPHOS 70 10/26/2017   AST 20 01/20/2021   ALT 16 01/20/2021   PROT 7.2 01/20/2021   ALBUMIN 4.2 10/26/2017   CALCIUM 9.1 01/20/2021   Lab Results  Component Value Date   CHOL 182 01/20/2021   Lab Results  Component Value Date   HDL  41 (L) 01/20/2021   Lab Results   Component Value Date   LDLCALC 127 (H) 01/20/2021   Lab Results  Component Value Date   TRIG 52 01/20/2021   Lab Results  Component Value Date   CHOLHDL 4.4 01/20/2021   Lab Results  Component Value Date   HGBA1C 5.2 01/20/2021      Assessment & Plan:   Problem List Items Addressed This Visit   None Visit Diagnoses     Impacted cerumen, left ear    -  Primary   Ear itching       Relevant Medications   acetic acid-hydrocortisone (VOSOL-HC) OTIC solution       Meds ordered this encounter  Medications   acetic acid-hydrocortisone (VOSOL-HC) OTIC solution    Sig: Place 3 drops into both ears 3 (three) times daily.    Dispense:  10 mL    Refill:  0    Order Specific Question:   Supervising Provider    Answer:   Carlota Raspberry, JEFFREY R [[id number]]    Follow-up: Return if symptoms worsen or fail to improve.   PLAN Lavage by Noah Delaine, RMA, successful. Some irritation and dead skin. Will give vosol-hc in case of any ongoing irritation or itching. Return if worsening or failing to improve Patient encouraged to call clinic with any questions, comments, or concerns.  Maximiano Coss, NP

## 2021-05-21 NOTE — Patient Instructions (Addendum)
Jenna Cox -   Looks like some irritation. I will send vosol-hc drops to use up to three times daily as needed for itching or discomfort in the ear. If this progresses, let me know and we can discuss next steps.  Thank you  [name]     If you have lab work done today you will be contacted with your lab results within the next 2 weeks.  If you have not heard from [place] then please contact us. The fastest way to get your results is to register for My Chart.   IF you received an x-ray today, you will receive an invoice from [facility]. Please contact [facility] at [phone] with questions or concerns regarding your invoice.   IF you received labwork today, you will receive an invoice from [facility]. Please contact [facility] at [phone] with questions or concerns regarding your invoice.   Our billing staff will not be able to assist you with questions regarding bills from these companies.  You will be contacted with the lab results as soon as they are available. The fastest way to get your results is to activate your My Chart account. Instructions are located on the last page of this paperwork. If you have not heard from [place] regarding the results in 2 weeks, please contact this office.

## 2022-01-23 ENCOUNTER — Encounter: Payer: Self-pay | Admitting: Family Medicine

## 2022-01-23 ENCOUNTER — Ambulatory Visit (INDEPENDENT_AMBULATORY_CARE_PROVIDER_SITE_OTHER): Payer: No Typology Code available for payment source | Admitting: Family Medicine

## 2022-01-23 VITALS — BP 134/72 | HR 78 | Temp 97.9°F | Resp 16 | Ht 64.0 in | Wt 162.2 lb

## 2022-01-23 DIAGNOSIS — Z13220 Encounter for screening for lipoid disorders: Secondary | ICD-10-CM

## 2022-01-23 DIAGNOSIS — Z131 Encounter for screening for diabetes mellitus: Secondary | ICD-10-CM

## 2022-01-23 DIAGNOSIS — D573 Sickle-cell trait: Secondary | ICD-10-CM

## 2022-01-23 DIAGNOSIS — Z0000 Encounter for general adult medical examination without abnormal findings: Secondary | ICD-10-CM

## 2022-01-23 DIAGNOSIS — D649 Anemia, unspecified: Secondary | ICD-10-CM

## 2022-01-23 NOTE — Addendum Note (Signed)
Addended by: Patrcia Dolly on: 01/23/2022 08:37 AM   Modules accepted: Orders

## 2022-01-23 NOTE — Progress Notes (Signed)
Subjective:  Patient ID: Jenna Cox, female    DOB: 21-Sep-1960  Age: 61 y.o. MRN: [id number]  CC:  Chief Complaint  Patient presents with   Annual Exam    Pt here for annual exam, no concerns, labs to quest-pt reports fasting other than apple cider vinegar tea     HPI Jenna Cox presents for Annual Exam  Some increased stress - handling ok. 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 Slowly improving. Denies needs at this time. Walking for exercise, time to herself and getting sufficient sleep. 8hrs/day.   History of sickle cell trait with anemia No melena/hemtochezia.  Lab Results  Component Value Date   WBC 7.2 01/20/2021   HGB 10.7 (L) 01/20/2021   HCT 32.4 (L) 01/20/2021   MCV 89.8 01/20/2021   PLT 228 01/20/2021   GYN: Prior hysterectomy for benign disease, cervical dysplasia, status post LEEP.  GYN Dr. Alesia Richards. Appt soon.      01/23/2022    7:59 AM 05/21/2021   11:11 AM 01/20/2021    4:20 PM 10/21/2020    3:20 PM 11/06/2017    8:15 AM  Depression screen PHQ 2/9  Decreased Interest 0 0 0 0 0  Down, Depressed, Hopeless 0 0 0 0 0  PHQ - 2 Score 0 0 0 0 0  Altered sleeping  0     Tired, decreased energy  0     Change in appetite  0     Feeling bad or failure about yourself   0     Trouble concentrating  0     Moving slowly or fidgety/restless  0     Suicidal thoughts  0     PHQ-9 Score  0     Difficult doing work/chores  Not difficult at all       Health Maintenance  Topic Date Due   COVID-19 Vaccine (3 - Booster for [place] series) 02/08/2022 (Originally 04/26/2020)   Zoster Vaccines- Shingrix (1 of 2) 04/25/2022 (Originally 03/14/1978)   MAMMOGRAM  01/24/2023 (Originally 09/07/2018)   INFLUENZA VACCINE  04/07/2022   TETANUS/TDAP  09/07/2025   COLONOSCOPY (Pts 45-33yr Insurance coverage will need to be confirmed)  12/31/2027   Hepatitis C Screening  Completed   HIV Screening  Completed   HPV VACCINES  Aged Out   PAP SMEAR-Modifier  Discontinued  Colonoscopy  12/30/2020, Dr. CBryan LemmaMammogram with GYN. Appt planned soon.  Status post hysterectomy.  Immunization History  Administered Date(s) Administered   Hepatitis A 07/31/1994, 08/21/2011   Hepatitis B 09/08/2015   IPV 08/25/1991, 07/04/1997, 04/01/2004   MMR 07/31/1994   PFIZER(Purple Top)SARS-COV-2 Vaccination 02/09/2020, 03/01/2020   Td 03/13/1985, 07/31/1994, 03/12/2004, 09/08/2015   Tdap 08/21/2011   Typhoid Live 04/01/2004   Typhoid Parenteral 08/21/2011   Yellow Fever 04/01/2004  Shingles vaccine: declines.  COVID booster/bivalent booster - declines.  Malaria [place]sure if [place] in [place].   No results found. Appt last week, wears glasses, no other eye disease.   Dental:Yes and Within Last 6 months appt few weeks ago.   Alcohol:none  Tobacco: none  Exercise: walking daily 470m usually. Balanced diet. No regular sugar beverages.  Wt Readings from Last 3 Encounters:  01/23/22 162 lb 3.2 oz (73.6 kg)  05/21/21 176 lb 9.6 oz (80.1 kg)  01/20/21 177 lb 9.6 oz (80.6 kg)   Lab Results  Component Value Date   HGBA1C 5.2 01/20/2021    Takes vit D supplement - 2000iu per day,  and MVI.  Low years ago, normal recently. Had to take rx dose in past. Declines testing today.  Last vitamin D Lab Results  Component Value Date   VD25OH 34 11/06/2017    History Patient Active Problem List   Diagnosis Date Noted   Sickle cell trait ([place]) 11/09/2017   Anemia 11/09/2017   Colon polyps 10/24/2011   Carpal tunnel syndrome 11/20/2009   Urinary tract infection, recurrent 62/13/0865   Follicular cyst 78/46/9629   Severe dysplasia of cervix 01/11/2006   Fibroids 01/11/2006   S/P LEEP of cervix 01/11/2006   Pelvic pain 01/11/2006   H/O hysterectomy for benign disease 01/11/2006   Past Medical History:  Diagnosis Date   Allergy    seasonal - runny nose, congesiton, itchy eyes   Asthma    Asthma    [employer]reesia 10/18/2020   Carpal  tunnel syndrome, bilateral    s/p Rt surgery but recurrence   Fibroids    Follicle cyst    H/O measles    H/O mumps    History of chicken pox    Pelvic pain    Recurrent UTI (urinary tract infection)    Severe dysplasia of cervix    Sickle cell anemia (HCC)    sickle cell trait   Sickle cell trait ([place])    Past Surgical History:  Procedure Laterality Date   ABDOMINAL HYSTERECTOMY  1998   CARPAL TUNNEL RELEASE Right 2001   CESAREAN SECTION     COLONOSCOPY     about 10 yrs ago   MYOMECTOMY     TUBAL LIGATION     Allergies  Allergen Reactions   Other    Sulfa Antibiotics    Prior to Admission medications   Medication Sig Start Date End Date Taking? Authorizing Provider  acetic acid-hydrocortisone (VOSOL-HC) OTIC solution Place 3 drops into both ears 3 (three) times daily. 05/21/21  Yes Maximiano Coss, NP  Ascorbic Acid (VITAMIN C) 1000 MG tablet Take 1,000 mg by mouth daily.   Yes [provider]  Cholecalciferol (VITAMIN D PO) Take 1 tablet by mouth as needed.   Yes [provider]  Multiple Vitamin (MULTIVITAMIN) tablet Take 1 tablet by mouth daily.   Yes [provider]   Social History   Socioeconomic History   Marital status: Married    Spouse name: Not on file   Number of children: Not on file   Years of education: Not on file   Highest education level: Bachelor's degree (e.g., BA, AB, BS)  Occupational History   Occupation: Pharmacist, hospital    Comment: middle school  Tobacco Use   Smoking status: Never   Smokeless tobacco: Never  Vaping Use   Vaping Use: Never used  Substance and Sexual Activity   Alcohol use: No   Drug use: No   Sexual activity: Yes    Birth control/protection: Surgical, Post-menopausal  Other Topics Concern   Not on file  Social History Narrative   Not on file   Social Determinants of Health   Financial Resource Strain: Not on file  Food Insecurity: Not on file  Transportation Needs: Not on file  Physical  Activity: Not on file  Stress: Not on file  Social Connections: Not on file  Intimate Partner Violence: Not on file   Review of Systems 13 point review of systems per patient health survey noted.  Negative other than as indicated above or in HPI.    Objective:   Vitals:   01/23/22 0756  BP:  134/72  Pulse: 78  Resp: 16  Temp: 97.9 F (36.6 C)  TempSrc: Temporal  SpO2: 97%  Weight: 162 lb 3.2 oz (73.6 kg)  Height: 5' 4"  (1.626 m)     Physical Exam Vitals reviewed.  Constitutional:      Appearance: She is well-developed.  HENT:     Head: Normocephalic and atraumatic.     Right Ear: External ear normal.     Left Ear: External ear normal.  Eyes:     Conjunctiva/sclera: Conjunctivae normal.     Pupils: Pupils are equal, round, and reactive to light.  Neck:     Thyroid: No thyromegaly.  Cardiovascular:     Rate and Rhythm: Normal rate and regular rhythm.     Heart sounds: Normal heart sounds. No murmur heard. Pulmonary:     Effort: Pulmonary effort is normal. No respiratory distress.     Breath sounds: Normal breath sounds. No wheezing.  Abdominal:     General: Bowel sounds are normal.     Palpations: Abdomen is soft.     Tenderness: There is no abdominal tenderness.  Musculoskeletal:        General: No tenderness. Normal range of motion.     Cervical back: Normal range of motion and neck supple.  Lymphadenopathy:     Cervical: No cervical adenopathy.  Skin:    General: Skin is warm and dry.     Findings: No rash.  Neurological:     Mental Status: She is alert and oriented to person, place, and time.  Psychiatric:        Behavior: Behavior normal.        Thought Content: Thought content normal.       Assessment & Plan:  Chablis Losh is a 61 y.o. female . Annual physical exam - Plan: Comprehensive metabolic panel, Lipid panel, Hemoglobin A1c  - -anticipatory guidance as below in AVS, screening labs above. Health maintenance items as above in HPI  discussed/recommended as applicable.   Screening for diabetes mellitus - Plan: Comprehensive metabolic panel, Hemoglobin A1c  Screening for hyperlipidemia - Plan: Comprehensive metabolic panel, Lipid panel  Sickle cell trait (HCC) Anemia, unspecified type - Plan: CBC  - check updated CBC for stability, asymptomatic.   No orders of the defined types were placed in this encounter.  Patient Instructions  Video visit is fine to discuss travel medicines if needed.  If any concerns on labs I will let you know.  Take care.    Preventive Care 63-74 Years Old, Female Preventive care refers to lifestyle choices and visits with your health care provider that can promote health and wellness. Preventive care visits are also called wellness exams. What can I expect for my preventive care visit? Counseling Your health care provider may ask you questions about your: Medical history, including: Past medical problems. Family medical history. Pregnancy history. Current health, including: Menstrual cycle. Method of birth control. Emotional well-being. Home life and relationship well-being. Sexual activity and sexual health. Lifestyle, including: Alcohol, nicotine or tobacco, and drug use. Access to firearms. Diet, exercise, and sleep habits. Work and work Statistician. Sunscreen use. Safety issues such as seatbelt and bike helmet use. Physical exam Your health care provider will check your: Height and weight. These may be used to calculate your BMI (body mass index). BMI is a measurement that tells if you are at a healthy weight. Waist circumference. This measures the distance around your waistline. This measurement also tells if you are at a healthy  weight and may help predict your risk of certain diseases, such as type 2 diabetes and high blood pressure. Heart rate and blood pressure. Body temperature. Skin for abnormal spots. What immunizations do I need?  Vaccines are usually given at  various ages, according to a schedule. Your health care provider will recommend vaccines for you based on your age, medical history, and lifestyle or other factors, such as travel or where you work. What tests do I need? Screening Your health care provider may recommend screening tests for certain conditions. This may include: Lipid and cholesterol levels. Diabetes screening. This is done by checking your blood sugar (glucose) after you have not eaten for a while (fasting). Pelvic exam and Pap test. Hepatitis B test. Hepatitis C test. HIV (human immunodeficiency virus) test. STI (sexually transmitted infection) testing, if you are at risk. Lung cancer screening. Colorectal cancer screening. Mammogram. Talk with your health care provider about when you should start having regular mammograms. This may depend on whether you have a family history of breast cancer. BRCA-related cancer screening. This may be done if you have a family history of breast, ovarian, tubal, or peritoneal cancers. Bone density scan. This is done to screen for osteoporosis. Talk with your health care provider about your test results, treatment options, and if necessary, the need for more tests. Follow these instructions at home: Eating and drinking  Eat a diet that includes fresh fruits and vegetables, whole grains, lean protein, and low-fat dairy products. Take vitamin and mineral supplements as recommended by your health care provider. Do not drink alcohol if: Your health care provider tells you not to drink. You are pregnant, may be pregnant, or are planning to become pregnant. If you drink alcohol: Limit how much you have to 0-1 drink a day. Know how much alcohol is in your drink. In the [place] one drink equals one 12 oz bottle of beer (355 mL), one 5 oz glass of wine (148 mL), or one 1 oz glass of hard liquor (44 mL). Lifestyle Brush your teeth every morning and night with fluoride toothpaste. Floss one time each  day. Exercise for at least 30 minutes 5 or more days each week. Do not use any products that contain nicotine or tobacco. These products include cigarettes, chewing tobacco, and vaping devices, such as e-cigarettes. If you need help quitting, ask your health care provider. Do not use drugs. If you are sexually active, practice safe sex. Use a condom or other form of protection to prevent STIs. If you do not wish to become pregnant, use a form of birth control. If you plan to become pregnant, see your health care provider for a prepregnancy visit. Take aspirin only as told by your health care provider. Make sure that you understand how much to take and what form to take. Work with your health care provider to find out whether it is safe and beneficial for you to take aspirin daily. Find healthy ways to manage stress, such as: Meditation, yoga, or listening to music. Journaling. Talking to a trusted person. Spending time with friends and family. Minimize exposure to UV radiation to reduce your risk of skin cancer. Safety Always wear your seat belt while driving or riding in a vehicle. Do not drive: If you have been drinking alcohol. Do not ride with someone who has been drinking. When you are tired or distracted. While texting. If you have been using any mind-altering substances or drugs. Wear a helmet and other protective equipment during  sports activities. If you have firearms in your house, make sure you follow all gun safety procedures. Seek help if you have been physically or sexually abused. What's next? Visit your health care provider once a year for an annual wellness visit. Ask your health care provider how often you should have your eyes and teeth checked. Stay up to date on all vaccines. This information is not intended to replace advice given to you by your health care provider. Make sure you discuss any questions you have with your health care provider. Document Revised:  02/19/2021 Document Reviewed: 02/19/2021 Elsevier Patient Education  [place],   Merri Ray, MD [place] 01/23/22 8:35 AM

## 2022-01-23 NOTE — Patient Instructions (Addendum)
Video visit is fine to discuss travel medicines if needed.  If any concerns on labs I will let you know.  Take care.    Preventive Care 51-63 Years Old, Female Preventive care refers to lifestyle choices and visits with your health care provider that can promote health and wellness. Preventive care visits are also called wellness exams. What can I expect for my preventive care visit? Counseling Your health care provider may ask you questions about your: Medical history, including: Past medical problems. Family medical history. Pregnancy history. Current health, including: Menstrual cycle. Method of birth control. Emotional well-being. Home life and relationship well-being. Sexual activity and sexual health. Lifestyle, including: Alcohol, nicotine or tobacco, and drug use. Access to firearms. Diet, exercise, and sleep habits. Work and work Statistician. Sunscreen use. Safety issues such as seatbelt and bike helmet use. Physical exam Your health care provider will check your: Height and weight. These may be used to calculate your BMI (body mass index). BMI is a measurement that tells if you are at a healthy weight. Waist circumference. This measures the distance around your waistline. This measurement also tells if you are at a healthy weight and may help predict your risk of certain diseases, such as type 2 diabetes and high blood pressure. Heart rate and blood pressure. Body temperature. Skin for abnormal spots. What immunizations do I need?  Vaccines are usually given at various ages, according to a schedule. Your health care provider will recommend vaccines for you based on your age, medical history, and lifestyle or other factors, such as travel or where you work. What tests do I need? Screening Your health care provider may recommend screening tests for certain conditions. This may include: Lipid and cholesterol levels. Diabetes screening. This is done by checking your  blood sugar (glucose) after you have not eaten for a while (fasting). Pelvic exam and Pap test. Hepatitis B test. Hepatitis C test. HIV (human immunodeficiency virus) test. STI (sexually transmitted infection) testing, if you are at risk. Lung cancer screening. Colorectal cancer screening. Mammogram. Talk with your health care provider about when you should start having regular mammograms. This may depend on whether you have a family history of breast cancer. BRCA-related cancer screening. This may be done if you have a family history of breast, ovarian, tubal, or peritoneal cancers. Bone density scan. This is done to screen for osteoporosis. Talk with your health care provider about your test results, treatment options, and if necessary, the need for more tests. Follow these instructions at home: Eating and drinking  Eat a diet that includes fresh fruits and vegetables, whole grains, lean protein, and low-fat dairy products. Take vitamin and mineral supplements as recommended by your health care provider. Do not drink alcohol if: Your health care provider tells you not to drink. You are pregnant, may be pregnant, or are planning to become pregnant. If you drink alcohol: Limit how much you have to 0-1 drink a day. Know how much alcohol is in your drink. In the [place] one drink equals one 12 oz bottle of beer (355 mL), one 5 oz glass of wine (148 mL), or one 1 oz glass of hard liquor (44 mL). Lifestyle Brush your teeth every morning and night with fluoride toothpaste. Floss one time each day. Exercise for at least 30 minutes 5 or more days each week. Do not use any products that contain nicotine or tobacco. These products include cigarettes, chewing tobacco, and vaping devices, such as e-cigarettes. If you need help  quitting, ask your health care provider. Do not use drugs. If you are sexually active, practice safe sex. Use a condom or other form of protection to prevent STIs. If you do  not wish to become pregnant, use a form of birth control. If you plan to become pregnant, see your health care provider for a prepregnancy visit. Take aspirin only as told by your health care provider. Make sure that you understand how much to take and what form to take. Work with your health care provider to find out whether it is safe and beneficial for you to take aspirin daily. Find healthy ways to manage stress, such as: Meditation, yoga, or listening to music. Journaling. Talking to a trusted person. Spending time with friends and family. Minimize exposure to UV radiation to reduce your risk of skin cancer. Safety Always wear your seat belt while driving or riding in a vehicle. Do not drive: If you have been drinking alcohol. Do not ride with someone who has been drinking. When you are tired or distracted. While texting. If you have been using any mind-altering substances or drugs. Wear a helmet and other protective equipment during sports activities. If you have firearms in your house, make sure you follow all gun safety procedures. Seek help if you have been physically or sexually abused. What's next? Visit your health care provider once a year for an annual wellness visit. Ask your health care provider how often you should have your eyes and teeth checked. Stay up to date on all vaccines. This information is not intended to replace advice given to you by your health care provider. Make sure you discuss any questions you have with your health care provider. Document Revised: 02/19/2021 Document Reviewed: 02/19/2021 Elsevier Patient Education  [place].

## 2022-01-24 LAB — CBC
HCT: 34.5 % — ABNORMAL LOW (ref 35.0–45.0)
Hemoglobin: 11.4 g/dL — ABNORMAL LOW (ref 11.7–15.5)
MCH: 29.2 pg (ref 27.0–33.0)
MCHC: 33 g/dL (ref 32.0–36.0)
MCV: 88.5 fL (ref 80.0–100.0)
MPV: 12.6 fL — ABNORMAL HIGH (ref 7.5–12.5)
Platelets: 242 10*3/uL (ref 140–400)
RBC: 3.9 10*6/uL (ref 3.80–5.10)
RDW: 14 % (ref 11.0–15.0)
WBC: 7.7 10*3/uL (ref 3.8–10.8)

## 2022-01-24 LAB — COMPREHENSIVE METABOLIC PANEL
AG Ratio: 1.3 (calc) (ref 1.0–2.5)
ALT: 16 U/L (ref 6–29)
AST: 17 U/L (ref 10–35)
Albumin: 3.8 g/dL (ref 3.6–5.1)
Alkaline phosphatase (APISO): 83 U/L (ref 37–153)
BUN: 16 mg/dL (ref 7–25)
CO2: 26 mmol/L (ref 20–32)
Calcium: 9 mg/dL (ref 8.6–10.4)
Chloride: 108 mmol/L (ref 98–110)
Creat: 0.93 mg/dL (ref 0.50–1.05)
Globulin: 3 g/dL (calc) (ref 1.9–3.7)
Glucose, Bld: 85 mg/dL (ref 65–99)
Potassium: 3.6 mmol/L (ref 3.5–5.3)
Sodium: 144 mmol/L (ref 135–146)
Total Bilirubin: 0.4 mg/dL (ref 0.2–1.2)
Total Protein: 6.8 g/dL (ref 6.1–8.1)

## 2022-01-24 LAB — LIPID PANEL
Cholesterol: 197 mg/dL (ref <200)
HDL: 44 mg/dL — ABNORMAL LOW (ref >=50)
LDL Cholesterol (Calc): 138 mg/dL (calc) — ABNORMAL HIGH
Non-HDL Cholesterol (Calc): 153 mg/dL (calc) — ABNORMAL HIGH (ref <130)
Total CHOL/HDL Ratio: 4.5 (calc) (ref <5.0)
Triglycerides: 55 mg/dL (ref <150)

## 2022-01-24 LAB — HEMOGLOBIN A1C
Hgb A1c MFr Bld: 5.4 % of total Hgb (ref <5.7)
Mean Plasma Glucose: 108 mg/dL
eAG (mmol/L): 6 mmol/L

## 2022-02-19 ENCOUNTER — Telehealth (INDEPENDENT_AMBULATORY_CARE_PROVIDER_SITE_OTHER): Payer: No Typology Code available for payment source | Admitting: Family Medicine

## 2022-02-19 ENCOUNTER — Encounter: Payer: Self-pay | Admitting: Family Medicine

## 2022-02-19 VITALS — Wt 169.3 lb

## 2022-02-19 DIAGNOSIS — Z298 Encounter for other specified prophylactic measures: Secondary | ICD-10-CM | POA: Diagnosis not present

## 2022-02-19 DIAGNOSIS — Z7184 Encounter for health counseling related to travel: Secondary | ICD-10-CM | POA: Diagnosis not present

## 2022-02-19 DIAGNOSIS — Z23 Encounter for immunization: Secondary | ICD-10-CM

## 2022-02-19 DIAGNOSIS — Z2989 Encounter for other specified prophylactic measures: Secondary | ICD-10-CM

## 2022-02-19 MED ORDER — MEFLOQUINE HCL 250 MG PO TABS: 250.0000 mg | ORAL_TABLET | ORAL | 0 refills | Status: DC

## 2022-02-19 MED ORDER — TYPHOID VACCINE PO CPDR: 1.0000 | DELAYED_RELEASE_CAPSULE | ORAL | 0 refills | Status: AC

## 2022-02-19 NOTE — Progress Notes (Signed)
Virtual Visit via Video Note  I connected with Jenna Cox on 02/19/22 at 12:07 PM by a video enabled telemedicine application and verified that I am speaking with the correct person using two identifiers.  Patient location: home, husband present, consent obtained.  My location: office - [place].    I discussed the limitations, risks, security and privacy concerns of performing an evaluation and management service by telephone and the availability of in person appointments. I also discussed with the patient that there may be a patient responsible charge related to this service. The patient expressed understanding and agreed to proceed, consent obtained  Chief complaint:  Chief Complaint  Patient presents with   Travel Consult    Pt discussing travel today, pt is traveling to [place] and is asking for malaria medications, no other concerns     History of Present Illness: Jenna Cox is a 61 y.o. female  Immunization review, travel counseling: Will be traveling to [place] 6/26 through on 8/10. 6 weeks 4 days of travel.  Prefers mefloquine for malaria prophylaxis (weekly preferred, tolerated prior). Mosquito repellant discussed.  Typhoid vaccine, last vivotif in 2017. Yellow fever in 2017 - repeat in 2027 Declines covid booster. Plans to wear mask.  Recommended hepB series continuation - declines for now.  Tdap 09/2015 Immunization History  Administered Date(s) Administered   Hepatitis A 07/31/1994, 08/21/2011   Hepatitis B 09/08/2015   IPV 08/25/1991, 07/04/1997, 04/01/2004   MMR 07/31/1994   PFIZER(Purple Top)SARS-COV-2 Vaccination 02/09/2020, 03/01/2020   Td 03/13/1985, 07/31/1994, 03/12/2004, 09/08/2015   Tdap 08/21/2011   Typhoid Live 04/01/2004   Typhoid Parenteral 08/21/2011   Yellow Fever 04/01/2004    Patient Active Problem List   Diagnosis Date Noted   Sickle cell trait ([place]) 11/09/2017   Anemia 11/09/2017   Colon polyps 10/24/2011   Carpal  tunnel syndrome 11/20/2009   Urinary tract infection, recurrent 96/22/2979   Follicular cyst 89/21/1941   Severe dysplasia of cervix 01/11/2006   Fibroids 01/11/2006   S/P LEEP of cervix 01/11/2006   Pelvic pain 01/11/2006   H/O hysterectomy for benign disease 01/11/2006   Past Medical History:  Diagnosis Date   Allergy    seasonal - runny nose, congesiton, itchy eyes   Asthma    Asthma    [employer] 10/18/2020   Carpal tunnel syndrome, bilateral    s/p Rt surgery but recurrence   Fibroids    Follicle cyst    H/O measles    H/O mumps    History of chicken pox    Pelvic pain    Recurrent UTI (urinary tract infection)    Severe dysplasia of cervix    Sickle cell anemia ([facility])    sickle cell trait   Sickle cell trait ([place])    Past Surgical History:  Procedure Laterality Date   ABDOMINAL HYSTERECTOMY  1998   CARPAL TUNNEL RELEASE Right 2001   CESAREAN SECTION     COLONOSCOPY     about 10 yrs ago   MYOMECTOMY     TUBAL LIGATION     Allergies  Allergen Reactions   Other    Sulfa Antibiotics    Prior to Admission medications   Medication Sig Start Date End Date Taking? Authorizing Provider  acetic acid-hydrocortisone (VOSOL-HC) OTIC solution Place 3 drops into both ears 3 (three) times daily. 05/21/21  Yes Maximiano Coss, NP  Ascorbic Acid (VITAMIN C) 1000 MG tablet Take 1,000 mg by mouth daily.   Yes [provider]  Cholecalciferol (VITAMIN  D PO) Take 1 tablet by mouth as needed.   Yes [provider]  Multiple Vitamin (MULTIVITAMIN) tablet Take 1 tablet by mouth daily.   Yes [provider]   Social History   Socioeconomic History   Marital status: Married    Spouse name: Not on file   Number of children: Not on file   Years of education: Not on file   Highest education level: Bachelor's degree (e.g., BA, AB, BS)  Occupational History   Occupation: Pharmacist, hospital    Comment: middle school  Tobacco Use   Smoking status: Never   Smokeless  tobacco: Never  Vaping Use   Vaping Use: Never used  Substance and Sexual Activity   Alcohol use: No   Drug use: No   Sexual activity: Yes    Birth control/protection: Surgical, Post-menopausal  Other Topics Concern   Not on file  Social History Narrative   Not on file   Social Determinants of Health   Financial Resource Strain: Not on file  Food Insecurity: Not on file  Transportation Needs: Not on file  Physical Activity: Not on file  Stress: Not on file  Social Connections: Not on file  Intimate Partner Violence: Not on file    Observations/Objective: Vitals:   02/19/22 1136  Weight: 169 lb 4.8 oz (76.8 kg)  No distress on video, no respiratory distress, nontoxic appearance.  Speaking full sentences without respiratory distress.  Coherent responses, all questions were answered with understanding of plan expressed.   Assessment and Plan: Counseling about travel  Need for malaria prophylaxis - Plan: mefloquine (LARIAM) 250 MG tablet  Need for prophylactic vaccination with typhoid-paratyphoid alone (TAB) - Plan: typhoid (VIVOTIF) DR capsule Malaria prophylaxis discussed, would like to try mefloquine instead of doxycycline.  Start now as recommended 2 weeks prior to travel, continue during her travels and for 4 weeks once she returns.  Has tolerated previously.  If not available at her pharmacy, okay to send to other pharmacy locally. Also appears to be due for typhoid, though if ordered but may have availability issues.  May need to contact health department about injection. CDC.gov for other travel recommendations and precautions.  Follow Up Instructions: As needed. Patient Instructions  You appear to be due for typhoid vaccine as last given in 2017 by my record.I will write the Vivotif prescription, but that may not be available. If not, call health department about the injection for typhoid. Mefloquine also ordered for malaria prophylaxis, once per week and continue  for 4 weeks once you return. Mosquito prevention as we discussed.  See CDC website regarding other recommendations for travel to [place] and precautions.  Please let me know if you have any questions but have a great trip and stay healthy.     I discussed the assessment and treatment plan with the patient. The patient was provided an opportunity to ask questions and all were answered. The patient agreed with the plan and demonstrated an understanding of the instructions.   The patient was advised to call back or seek an in-person evaluation if the symptoms worsen or if the condition fails to improve as anticipated.   Wendie Agreste, MD

## 2022-02-19 NOTE — Patient Instructions (Addendum)
You appear to be due for typhoid vaccine as last given in 2017 by my record.I will write the Vivotif prescription, but that may not be available. If not, call health department about the injection for typhoid. Mefloquine also ordered for malaria prophylaxis, once per week and continue for 4 weeks once you return. Mosquito prevention as we discussed.  See CDC website regarding other recommendations for travel to [place] and precautions.  Please let me know if you have any questions but have a great trip and stay healthy.

## 2022-05-10 ENCOUNTER — Encounter: Payer: Self-pay | Admitting: Family Medicine

## 2022-05-15 ENCOUNTER — Ambulatory Visit: Payer: No Typology Code available for payment source

## 2022-05-15 DIAGNOSIS — Z111 Encounter for screening for respiratory tuberculosis: Secondary | ICD-10-CM

## 2022-05-27 ENCOUNTER — Telehealth: Payer: Self-pay | Admitting: Family Medicine

## 2022-05-27 NOTE — Telephone Encounter (Signed)
Pt states that she it to [facility]S. [facility] Dy[facility]S put form in Dr.Greene's bin up front.

## 2022-05-27 NOTE — Telephone Encounter (Signed)
Caller name: Jillene Wehrenberg   On DPR? :yes/no: Yes  Call back number: [phone]  Provider they see: Carlota Raspberry  Reason for call:  Pt had a nurse visit on 05/15/22 for a TB test. Pt states she gave form to a staff member to be filled out. Do anyone knows anything about this form? If so, is it ready for pick up?

## 2022-05-27 NOTE — Telephone Encounter (Signed)
Appears a quantiferon Gold was drawn on that date, no results in the chart for that but no documentation mention a form to be filled out

## 2022-05-28 LAB — QUANTIFERON-TB GOLD PLUS
Mitogen-NIL: 3.71 IU/mL
NIL: 0.11 IU/mL
QuantiFERON-TB Gold Plus: NEGATIVE
TB1-NIL: <0 IU/mL
TB2-NIL: <0 IU/mL

## 2022-05-28 NOTE — Telephone Encounter (Signed)
Do you happen to have this? Jenna Cox has reported she does not and I do not have this form either.

## 2022-05-28 NOTE — Telephone Encounter (Signed)
I do not have that form. Can we request a new one and can complete once received? Additionally I do not see a result on the 9/8 quantiferon testing. Please check into status. Thanks.

## 2022-09-02 ENCOUNTER — Telehealth: Payer: Self-pay | Admitting: Family Medicine

## 2022-09-02 NOTE — Telephone Encounter (Signed)
In your sign folder.

## 2022-09-02 NOTE — Telephone Encounter (Signed)
Received a health examination form from this patient. Placed in front bin. Patient wants a copy of form after its been complete.

## 2022-09-03 NOTE — Telephone Encounter (Signed)
Paperwork completed and placed in fax bin at back nurse station, please provide copy to patient when complete.

## 2022-09-04 NOTE — Telephone Encounter (Signed)
Spoke with pt husband has appt today they will pick up at that time

## 2023-01-27 ENCOUNTER — Encounter: Payer: No Typology Code available for payment source | Admitting: Family Medicine

## 2023-02-22 LAB — HM MAMMOGRAPHY

## 2023-02-26 ENCOUNTER — Ambulatory Visit (INDEPENDENT_AMBULATORY_CARE_PROVIDER_SITE_OTHER): Payer: No Typology Code available for payment source | Admitting: Family Medicine

## 2023-02-26 VITALS — BP 126/70 | HR 83 | Temp 97.9°F | Ht 64.0 in | Wt 153.8 lb

## 2023-02-26 DIAGNOSIS — Z0000 Encounter for general adult medical examination without abnormal findings: Secondary | ICD-10-CM

## 2023-02-26 DIAGNOSIS — R634 Abnormal weight loss: Secondary | ICD-10-CM | POA: Diagnosis not present

## 2023-02-26 DIAGNOSIS — Z13220 Encounter for screening for lipoid disorders: Secondary | ICD-10-CM | POA: Diagnosis not present

## 2023-02-26 DIAGNOSIS — Z131 Encounter for screening for diabetes mellitus: Secondary | ICD-10-CM

## 2023-02-26 DIAGNOSIS — D573 Sickle-cell trait: Secondary | ICD-10-CM

## 2023-02-26 DIAGNOSIS — E559 Vitamin D deficiency, unspecified: Secondary | ICD-10-CM

## 2023-02-26 DIAGNOSIS — D649 Anemia, unspecified: Secondary | ICD-10-CM

## 2023-02-26 NOTE — Progress Notes (Unsigned)
Subjective:  Patient ID: Jenna Cox, female    DOB: 08/08/1961  Age: 62 y.o. MRN: [id number]  CC:  Chief Complaint  Patient presents with   Annual Exam    Pt is not fasting     HPI Jenna Cox presents for Annual Exam Doing ok. Getting along. No specific health concerns today.   PCP, me Gynecology Dr. Sallye Ober, appointment April 24.  history of hysterectomy for benign disease, cervical dysplasia, prior LEEP before hysterectomy.  Reports no further pap needed.  Podiatry, Dr. Charlsie Merles  History of sickle cell trait with anemia Lab Results  Component Value Date   WBC 7.7 01/23/2022   HGB 11.4 (L) 01/23/2022   HCT 34.5 (L) 01/23/2022   MCV 88.5 01/23/2022   PLT 242 01/23/2022    Prior history of vitamin D supplementation for deficiency,, declined testing in 2023.infrequent supplement. MVI only Last vitamin D Lab Results  Component Value Date   VD25OH 34 11/06/2017       02/26/2023    9:08 AM 01/23/2022    7:59 AM 05/21/2021   11:11 AM 01/20/2021    4:20 PM 10/21/2020    3:20 PM  Depression screen PHQ 2/9  Decreased Interest 0 0 0 0 0  Down, Depressed, Hopeless 0 0 0 0 0  PHQ - 2 Score 0 0 0 0 0  Altered sleeping 0  0    Tired, decreased energy 1  0    Change in appetite 0  0    Feeling bad or failure about yourself  0  0    Trouble concentrating 0  0    Moving slowly or fidgety/restless 0  0    Suicidal thoughts 0  0    PHQ-9 Score 1  0    Difficult doing work/chores   Not difficult at all      Health Maintenance  Topic Date Due   Zoster Vaccines- Shingrix (1 of 2) Never done   MAMMOGRAM  09/07/2018   COVID-19 Vaccine (3 - 2023-24 season) 05/08/2022   INFLUENZA VACCINE  04/08/2023   DTaP/Tdap/Td (6 - Td or Tdap) 09/07/2025   Colonoscopy  12/31/2027   Hepatitis C Screening  Completed   HIV Screening  Completed   HPV VACCINES  Aged Out   PAP SMEAR-Modifier  Discontinued  Colonoscopy 12/30/2020, Dr. Barron Alvine, few polyps.  Adenomatous.  Repeat 7  years. Pap testing with GYN as above Mammogram: recently at [facility]. Few days ago. Told was ok. Repeat 1 year.   Immunization History  Administered Date(s) Administered   Hepatitis A 07/31/1994, 08/21/2011   Hepatitis B 09/08/2015   IPV 08/25/1991, 07/04/1997, 04/01/2004   MMR 07/31/1994   PFIZER(Purple Top)SARS-COV-2 Vaccination 02/09/2020, 03/01/2020   Td 03/13/1985, 07/31/1994, 03/12/2004, 09/08/2015   Tdap 08/21/2011   Typhoid Live 04/01/2004   Typhoid Parenteral 08/21/2011   Yellow Fever 04/01/2004  COVID booster recommended with flu vaccine in the fall.  Declines COVID vaccine. Shingles vaccine: Declines RSV vaccine: Declines  No results found. Glasses, due for appt. Last visit 1 year ago.   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 Plans on other exercise during the summer. Gym exercise.  Weight has decreased since last year. 16# past year. During school year, difficulty getting breakfast at times, decreased lunch at times.  Thinks that decreased intake is reason for weight loss as well as increased activity. Planned on losing  weight.  No fever, night sweats, or new bone pain.   Body mass index is 26.4 kg/m. Wt Readings from Last 3 Encounters:  02/26/23 153 lb 12.8 oz (69.8 kg)  02/19/22 169 lb 4.8 oz (76.8 kg)  01/23/22 162 lb 3.2 oz (73.6 kg)     History Patient Active Problem List   Diagnosis Date Noted   Sickle cell trait (HCC) 11/09/2017   Anemia 11/09/2017   Colon polyps 10/24/2011   Carpal tunnel syndrome 11/20/2009   Urinary tract infection, recurrent 01/25/2006   Follicular cyst 01/25/2006   Severe dysplasia of cervix 01/11/2006   Fibroids 01/11/2006   S/P LEEP of cervix 01/11/2006   Pelvic pain 01/11/2006   H/O hysterectomy for benign disease 01/11/2006   Past Medical History:  Diagnosis Date   Allergy    seasonal - runny nose, congesiton,  itchy eyes   Asthma    Asthma    [employer] 10/18/2020   Carpal tunnel syndrome, bilateral    s/p Rt surgery but recurrence   Fibroids    Follicle cyst    H/O measles    H/O mumps    History of chicken pox    Pelvic pain    Recurrent UTI (urinary tract infection)    Severe dysplasia of cervix    Sickle cell anemia (HCC)    sickle cell trait   Sickle cell trait (HCC)    Past Surgical History:  Procedure Laterality Date   ABDOMINAL HYSTERECTOMY  1998   CARPAL TUNNEL RELEASE Right 2001   CESAREAN SECTION     COLONOSCOPY     about 10 yrs ago   MYOMECTOMY     TUBAL LIGATION     Allergies  Allergen Reactions   Other    Sulfa Antibiotics    Prior to Admission medications   Medication Sig Start Date End Date Taking? Authorizing Provider  acetic acid-hydrocortisone (VOSOL-HC) OTIC solution Place 3 drops into both ears 3 (three) times daily. 05/21/21  Yes Janeece Agee, NP  Multiple Vitamin (MULTIVITAMIN) tablet Take 1 tablet by mouth daily.   Yes [provider]  Ascorbic Acid (VITAMIN C) 1000 MG tablet Take 1,000 mg by mouth daily. Patient not taking: Reported on 02/26/2023    [provider]  Cholecalciferol (VITAMIN D PO) Take 1 tablet by mouth as needed. Patient not taking: Reported on 02/26/2023    [provider]  mefloquine (LARIAM) 250 MG tablet Take 1 tablet (250 mg total) by mouth every 7 (seven) days. Until finished. Patient not taking: Reported on 02/26/2023 02/19/22   Shade Flood, MD  typhoid (VIVOTIF) DR capsule Take 1 capsule by mouth every other day. Patient not taking: Reported on 02/26/2023 02/19/22   Shade Flood, MD   Social History   Socioeconomic History   Marital status: Married    Spouse name: Not on file   Number of children: Not on file   Years of education: Not on file   Highest education level: Bachelor's degree (e.g., BA, AB, BS)  Occupational History   Occupation: Runner, broadcasting/film/video    Comment: middle school  Tobacco  Use   Smoking status: Never   Smokeless tobacco: Never  Vaping Use   Vaping Use: Never used  Substance and Sexual Activity   Alcohol use: No   Drug use: No   Sexual activity: Yes    Birth control/protection: Surgical, Post-menopausal  Other Topics Concern   Not on file  Social History Narrative   Not on file  Social Determinants of Health   Financial Resource Strain: Not on file  Food Insecurity: Not on file  Transportation Needs: Not on file  Physical Activity: Not on file  Stress: Not on file  Social Connections: Not on file  Intimate Partner Violence: Not on file    Review of Systems  13 point review of systems per patient health survey noted.  Negative other than as indicated above or in HPI.   Objective:   Vitals:   02/26/23 0909  BP: 126/70  Pulse: 83  Temp: 97.9 F (36.6 C)  TempSrc: Temporal  SpO2: 100%  Weight: 153 lb 12.8 oz (69.8 kg)  Height: 5\' 4"  (1.626 m)   {Vitals History (Optional):23777}  Physical Exam Constitutional:      Appearance: She is well-developed.  HENT:     Head: Normocephalic and atraumatic.     Right Ear: External ear normal.     Left Ear: External ear normal.  Eyes:     Conjunctiva/sclera: Conjunctivae normal.     Pupils: Pupils are equal, round, and reactive to light.  Neck:     Thyroid: No thyromegaly.  Cardiovascular:     Rate and Rhythm: Normal rate and regular rhythm.     Heart sounds: Normal heart sounds. No murmur heard. Pulmonary:     Effort: Pulmonary effort is normal. No respiratory distress.     Breath sounds: Normal breath sounds. No wheezing.  Abdominal:     General: Bowel sounds are normal.     Palpations: Abdomen is soft.     Tenderness: There is no abdominal tenderness.  Musculoskeletal:        General: No tenderness. Normal range of motion.     Cervical back: Normal range of motion and neck supple.  Lymphadenopathy:     Cervical: No cervical adenopathy.  Skin:    General: Skin is warm and dry.      Findings: No rash.  Neurological:     Mental Status: She is alert and oriented to person, place, and time.  Psychiatric:        Behavior: Behavior normal.        Thought Content: Thought content normal.        Assessment & Plan:  Jenna Cox is a 62 y.o. female . Annual physical exam - Plan: Hemoglobin A1c, Lipid panel, Comprehensive metabolic panel  Screening for diabetes mellitus - Plan: Hemoglobin A1c, Comprehensive metabolic panel  Screening for hyperlipidemia - Plan: Lipid panel, Comprehensive metabolic panel  Weight loss - Plan: CBC, TSH, Sedimentation rate  Anemia, unspecified type - Plan: CBC  Sickle cell trait (HCC)  Vitamin D deficiency - Plan: Vitamin D (25 hydroxy)   No orders of the defined types were placed in this encounter.  Patient Instructions  Thank you for coming in today.  I do want to keep an eye on the weight loss.  Make sure to eat 3 meals per day and not skip breakfast, healthy snacks in between if needed, especially if you are more active.  Continue to stay hydrated during the day.  I will check some screening labs including screening labs for weight loss but follow-up in 1 month and can discuss other testing if needed at that time.  If any new symptoms prior to that time I am happy to see you sooner.  Take care!  Preventive Care 29-48 Years Old, Female Preventive care refers to lifestyle choices and visits with your health care provider that can promote health and wellness. Preventive  care visits are also called wellness exams. What can I expect for my preventive care visit? Counseling Your health care provider may ask you questions about your: Medical history, including: Past medical problems. Family medical history. Pregnancy history. Current health, including: Menstrual cycle. Method of birth control. Emotional well-being. Home life and relationship well-being. Sexual activity and sexual health. Lifestyle, including: Alcohol,  nicotine or tobacco, and drug use. Access to firearms. Diet, exercise, and sleep habits. Work and work Astronomer. Sunscreen use. Safety issues such as seatbelt and bike helmet use. Physical exam Your health care provider will check your: Height and weight. These may be used to calculate your BMI (body mass index). BMI is a measurement that tells if you are at a healthy weight. Waist circumference. This measures the distance around your waistline. This measurement also tells if you are at a healthy weight and may help predict your risk of certain diseases, such as type 2 diabetes and high blood pressure. Heart rate and blood pressure. Body temperature. Skin for abnormal spots. What immunizations do I need?  Vaccines are usually given at various ages, according to a schedule. Your health care provider will recommend vaccines for you based on your age, medical history, and lifestyle or other factors, such as travel or where you work. What tests do I need? Screening Your health care provider may recommend screening tests for certain conditions. This may include: Lipid and cholesterol levels. Diabetes screening. This is done by checking your blood sugar (glucose) after you have not eaten for a while (fasting). Pelvic exam and Pap test. Hepatitis B test. Hepatitis C test. HIV (human immunodeficiency virus) test. STI (sexually transmitted infection) testing, if you are at risk. Lung cancer screening. Colorectal cancer screening. Mammogram. Talk with your health care provider about when you should start having regular mammograms. This may depend on whether you have a family history of breast cancer. BRCA-related cancer screening. This may be done if you have a family history of breast, ovarian, tubal, or peritoneal cancers. Bone density scan. This is done to screen for osteoporosis. Talk with your health care provider about your test results, treatment options, and if necessary, the need for  more tests. Follow these instructions at home: Eating and drinking  Eat a diet that includes fresh fruits and vegetables, whole grains, lean protein, and low-fat dairy products. Take vitamin and mineral supplements as recommended by your health care provider. Do not drink alcohol if: Your health care provider tells you not to drink. You are pregnant, may be pregnant, or are planning to become pregnant. If you drink alcohol: Limit how much you have to 0-1 drink a day. Know how much alcohol is in your drink. In the [place] one drink equals one 12 oz bottle of beer (355 mL), one 5 oz glass of wine (148 mL), or one 1 oz glass of hard liquor (44 mL). Lifestyle Brush your teeth every morning and night with fluoride toothpaste. Floss one time each day. Exercise for at least 30 minutes 5 or more days each week. Do not use any products that contain nicotine or tobacco. These products include cigarettes, chewing tobacco, and vaping devices, such as e-cigarettes. If you need help quitting, ask your health care provider. Do not use drugs. If you are sexually active, practice safe sex. Use a condom or other form of protection to prevent STIs. If you do not wish to become pregnant, use a form of birth control. If you plan to become pregnant, see your health  care provider for a prepregnancy visit. Take aspirin only as told by your health care provider. Make sure that you understand how much to take and what form to take. Work with your health care provider to find out whether it is safe and beneficial for you to take aspirin daily. Find healthy ways to manage stress, such as: Meditation, yoga, or listening to music. Journaling. Talking to a trusted person. Spending time with friends and family. Minimize exposure to UV radiation to reduce your risk of skin cancer. Safety Always wear your seat belt while driving or riding in a vehicle. Do not drive: If you have been drinking alcohol. Do not ride with  someone who has been drinking. When you are tired or distracted. While texting. If you have been using any mind-altering substances or drugs. Wear a helmet and other protective equipment during sports activities. If you have firearms in your house, make sure you follow all gun safety procedures. Seek help if you have been physically or sexually abused. What's next? Visit your health care provider once a year for an annual wellness visit. Ask your health care provider how often you should have your eyes and teeth checked. Stay up to date on all vaccines. This information is not intended to replace advice given to you by your health care provider. Make sure you discuss any questions you have with your health care provider. Document Revised: 02/19/2021 Document Reviewed: 02/19/2021 Elsevier Patient Education  2024 Elsevier Inc.     Signed,   Meredith Staggers, MD [facility], [facility] 02/26/23 9:52 AM

## 2023-02-26 NOTE — Patient Instructions (Signed)
Thank you for coming in today.  I do want to keep an eye on the weight loss.  Make sure to eat 3 meals per day and not skip breakfast, healthy snacks in between if needed, especially if you are more active.  Continue to stay hydrated during the day.  I will check some screening labs including screening labs for weight loss but follow-up in 1 month and can discuss other testing if needed at that time.  If any new symptoms prior to that time I am happy to see you sooner.  Take care!  Preventive Care 64-20 Years Old, Female Preventive care refers to lifestyle choices and visits with your health care provider that can promote health and wellness. Preventive care visits are also called wellness exams. What can I expect for my preventive care visit? Counseling Your health care provider may ask you questions about your: Medical history, including: Past medical problems. Family medical history. Pregnancy history. Current health, including: Menstrual cycle. Method of birth control. Emotional well-being. Home life and relationship well-being. Sexual activity and sexual health. Lifestyle, including: Alcohol, nicotine or tobacco, and drug use. Access to firearms. Diet, exercise, and sleep habits. Work and work Astronomer. Sunscreen use. Safety issues such as seatbelt and bike helmet use. Physical exam Your health care provider will check your: Height and weight. These may be used to calculate your BMI (body mass index). BMI is a measurement that tells if you are at a healthy weight. Waist circumference. This measures the distance around your waistline. This measurement also tells if you are at a healthy weight and may help predict your risk of certain diseases, such as type 2 diabetes and high blood pressure. Heart rate and blood pressure. Body temperature. Skin for abnormal spots. What immunizations do I need?  Vaccines are usually given at various ages, according to a schedule. Your health  care provider will recommend vaccines for you based on your age, medical history, and lifestyle or other factors, such as travel or where you work. What tests do I need? Screening Your health care provider may recommend screening tests for certain conditions. This may include: Lipid and cholesterol levels. Diabetes screening. This is done by checking your blood sugar (glucose) after you have not eaten for a while (fasting). Pelvic exam and Pap test. Hepatitis B test. Hepatitis C test. HIV (human immunodeficiency virus) test. STI (sexually transmitted infection) testing, if you are at risk. Lung cancer screening. Colorectal cancer screening. Mammogram. Talk with your health care provider about when you should start having regular mammograms. This may depend on whether you have a family history of breast cancer. BRCA-related cancer screening. This may be done if you have a family history of breast, ovarian, tubal, or peritoneal cancers. Bone density scan. This is done to screen for osteoporosis. Talk with your health care provider about your test results, treatment options, and if necessary, the need for more tests. Follow these instructions at home: Eating and drinking  Eat a diet that includes fresh fruits and vegetables, whole grains, lean protein, and low-fat dairy products. Take vitamin and mineral supplements as recommended by your health care provider. Do not drink alcohol if: Your health care provider tells you not to drink. You are pregnant, may be pregnant, or are planning to become pregnant. If you drink alcohol: Limit how much you have to 0-1 drink a day. Know how much alcohol is in your drink. In the [place] one drink equals one 12 oz bottle of beer (355 mL),  one 5 oz glass of wine (148 mL), or one 1 oz glass of hard liquor (44 mL). Lifestyle Brush your teeth every morning and night with fluoride toothpaste. Floss one time each day. Exercise for at least 30 minutes 5 or more  days each week. Do not use any products that contain nicotine or tobacco. These products include cigarettes, chewing tobacco, and vaping devices, such as e-cigarettes. If you need help quitting, ask your health care provider. Do not use drugs. If you are sexually active, practice safe sex. Use a condom or other form of protection to prevent STIs. If you do not wish to become pregnant, use a form of birth control. If you plan to become pregnant, see your health care provider for a prepregnancy visit. Take aspirin only as told by your health care provider. Make sure that you understand how much to take and what form to take. Work with your health care provider to find out whether it is safe and beneficial for you to take aspirin daily. Find healthy ways to manage stress, such as: Meditation, yoga, or listening to music. Journaling. Talking to a trusted person. Spending time with friends and family. Minimize exposure to UV radiation to reduce your risk of skin cancer. Safety Always wear your seat belt while driving or riding in a vehicle. Do not drive: If you have been drinking alcohol. Do not ride with someone who has been drinking. When you are tired or distracted. While texting. If you have been using any mind-altering substances or drugs. Wear a helmet and other protective equipment during sports activities. If you have firearms in your house, make sure you follow all gun safety procedures. Seek help if you have been physically or sexually abused. What's next? Visit your health care provider once a year for an annual wellness visit. Ask your health care provider how often you should have your eyes and teeth checked. Stay up to date on all vaccines. This information is not intended to replace advice given to you by your health care provider. Make sure you discuss any questions you have with your health care provider. Document Revised: 02/19/2021 Document Reviewed: 02/19/2021 Elsevier  Patient Education  2024 [employer].

## 2023-02-27 ENCOUNTER — Encounter: Payer: Self-pay | Admitting: Family Medicine

## 2023-03-03 ENCOUNTER — Other Ambulatory Visit: Payer: Self-pay

## 2023-03-03 ENCOUNTER — Other Ambulatory Visit: Payer: No Typology Code available for payment source

## 2023-03-03 DIAGNOSIS — R634 Abnormal weight loss: Secondary | ICD-10-CM

## 2023-03-03 DIAGNOSIS — Z1329 Encounter for screening for other suspected endocrine disorder: Secondary | ICD-10-CM

## 2023-03-03 DIAGNOSIS — Z13220 Encounter for screening for lipoid disorders: Secondary | ICD-10-CM

## 2023-03-03 DIAGNOSIS — D649 Anemia, unspecified: Secondary | ICD-10-CM

## 2023-03-03 DIAGNOSIS — Z0000 Encounter for general adult medical examination without abnormal findings: Secondary | ICD-10-CM

## 2023-03-03 DIAGNOSIS — E559 Vitamin D deficiency, unspecified: Secondary | ICD-10-CM

## 2023-03-03 DIAGNOSIS — Z131 Encounter for screening for diabetes mellitus: Secondary | ICD-10-CM

## 2023-03-04 LAB — CBC WITH DIFFERENTIAL/PLATELET
Absolute Monocytes: 459 cells/uL (ref 200–950)
Basophils Absolute: 37 cells/uL (ref 0–200)
Basophils Relative: 0.6 %
Eosinophils Absolute: 248 cells/uL (ref 15–500)
Eosinophils Relative: 4 %
HCT: 34 % — ABNORMAL LOW (ref 35.0–45.0)
Hemoglobin: 11 g/dL — ABNORMAL LOW (ref 11.7–15.5)
Lymphs Abs: 1711 cells/uL (ref 850–3900)
MCH: 29 pg (ref 27.0–33.0)
MCHC: 32.4 g/dL (ref 32.0–36.0)
MCV: 89.7 fL (ref 80.0–100.0)
MPV: 11.9 fL (ref 7.5–12.5)
Monocytes Relative: 7.4 %
Neutro Abs: 3745 cells/uL (ref 1500–7800)
Neutrophils Relative %: 60.4 %
Platelets: 227 10*3/uL (ref 140–400)
RBC: 3.79 10*6/uL — ABNORMAL LOW (ref 3.80–5.10)
RDW: 13.9 % (ref 11.0–15.0)
Total Lymphocyte: 27.6 %
WBC: 6.2 10*3/uL (ref 3.8–10.8)

## 2023-03-04 LAB — COMPREHENSIVE METABOLIC PANEL
AG Ratio: 1.2 (calc) (ref 1.0–2.5)
ALT: 19 U/L (ref 6–29)
AST: 17 U/L (ref 10–35)
Albumin: 3.8 g/dL (ref 3.6–5.1)
Alkaline phosphatase (APISO): 68 U/L (ref 37–153)
BUN/Creatinine Ratio: 21 (calc) (ref 6–22)
BUN: 23 mg/dL (ref 7–25)
CO2: 25 mmol/L (ref 20–32)
Calcium: 9.2 mg/dL (ref 8.6–10.4)
Chloride: 109 mmol/L (ref 98–110)
Creat: 1.08 mg/dL — ABNORMAL HIGH (ref 0.50–1.05)
Globulin: 3.1 g/dL (calc) (ref 1.9–3.7)
Glucose, Bld: 79 mg/dL (ref 65–99)
Potassium: 4.2 mmol/L (ref 3.5–5.3)
Sodium: 144 mmol/L (ref 135–146)
Total Bilirubin: 0.4 mg/dL (ref 0.2–1.2)
Total Protein: 6.9 g/dL (ref 6.1–8.1)

## 2023-03-04 LAB — LIPID PANEL
Cholesterol: 208 mg/dL — ABNORMAL HIGH (ref <200)
HDL: 57 mg/dL (ref >=50)
LDL Cholesterol (Calc): 136 mg/dL (calc) — ABNORMAL HIGH
Non-HDL Cholesterol (Calc): 151 mg/dL (calc) — ABNORMAL HIGH (ref <130)
Total CHOL/HDL Ratio: 3.6 (calc) (ref <5.0)
Triglycerides: 60 mg/dL (ref <150)

## 2023-03-04 LAB — HEMOGLOBIN A1C
Hgb A1c MFr Bld: 5.6 % of total Hgb (ref <5.7)
Mean Plasma Glucose: 114 mg/dL
eAG (mmol/L): 6.3 mmol/L

## 2023-03-04 LAB — TSH: TSH: 1.68 mIU/L (ref 0.40–4.50)

## 2023-03-04 LAB — SEDIMENTATION RATE: Sed Rate: 86 mm/h — ABNORMAL HIGH (ref 0–30)

## 2023-03-04 LAB — VITAMIN D 25 HYDROXY (VIT D DEFICIENCY, FRACTURES): Vit D, 25-Hydroxy: 34 ng/mL (ref 30–100)

## 2023-03-12 ENCOUNTER — Telehealth: Payer: Self-pay | Admitting: Family Medicine

## 2023-03-17 ENCOUNTER — Telehealth: Payer: Self-pay

## 2023-03-17 NOTE — Telephone Encounter (Signed)
Pt is missing most recent mammogram as well as question about whether she has had Shingles vaccine, called to find out where she had mammo and if she has had a shingles vaccination

## 2023-03-24 ENCOUNTER — Encounter: Payer: Self-pay | Admitting: Family Medicine

## 2023-03-24 ENCOUNTER — Ambulatory Visit (INDEPENDENT_AMBULATORY_CARE_PROVIDER_SITE_OTHER): Payer: No Typology Code available for payment source | Admitting: Family Medicine

## 2023-03-24 VITALS — BP 128/80 | HR 73 | Temp 98.1°F | Ht 64.0 in | Wt 154.4 lb

## 2023-03-24 DIAGNOSIS — R7989 Other specified abnormal findings of blood chemistry: Secondary | ICD-10-CM

## 2023-03-24 DIAGNOSIS — Z2989 Encounter for other specified prophylactic measures: Secondary | ICD-10-CM

## 2023-03-24 DIAGNOSIS — D649 Anemia, unspecified: Secondary | ICD-10-CM | POA: Diagnosis not present

## 2023-03-24 DIAGNOSIS — R634 Abnormal weight loss: Secondary | ICD-10-CM

## 2023-03-24 DIAGNOSIS — R700 Elevated erythrocyte sedimentation rate: Secondary | ICD-10-CM

## 2023-03-24 MED ORDER — MEFLOQUINE HCL 250 MG PO TABS
250.0000 mg | ORAL_TABLET | ORAL | 0 refills | Status: AC
Start: 2023-03-24 — End: ?

## 2023-03-24 NOTE — Addendum Note (Signed)
Addended by: Eldred Manges on: 03/24/2023 10:21 AM   Modules accepted: Orders

## 2023-03-24 NOTE — Patient Instructions (Signed)
Continues to try to have breakfast each day, lunch each day or meal replacement shake if needed such as Ensure or boost at the minimum.  Weight is back up 1 pound today.  I will recheck some of the lab work including elevated sed rate and if that is still high I may request that you see hematology.  I will let you know once I review the results.  Follow-up with me once you return from out of the country. Return to the clinic or go to the nearest emergency room if any of your symptoms worsen or new symptoms occur.  Start malaria prophylaxis 2 weeks prior to travel, continue weekly during travel and continue for 4 weeks upon return.  We did not have a chance to review other recommendations for travel today but I am happy to meet with you separately or you can consult the [facility] department travel department.  Here is a link to the CDC recommendations for travel.  Let me know if I can help further.  Take care.   [email or web address]

## 2023-03-24 NOTE — Progress Notes (Signed)
Subjective:  Patient ID: Jenna Cox, female    DOB: December 05, 1960  Age: 62 y.o. MRN: [id number]  CC:  Chief Complaint  Patient presents with   Weight Loss    Check in, no concerns. Discuss labs from end of June     HPI Jenna Cox presents for   Weight loss Discussed at June physical appointment.  16 pound weight loss over the past year.  She did notice difficulty getting breakfast at times during the school year and decrease lunch at times, thought that decreased food intake was likely reason for weight loss but had also been increasing her activity and exercise.  There was some intentional weight loss, no fever, night sweats or bone pain.  Up-to-date on colonoscopy in 2022 with plan to repeat in 7 years, as well as mammogram and Pap testing with GYN. Labs were ordered including sed rate which was elevated at 86.  Vitamin D level, TSH were normal.  Slight increase in creatinine from 0.9-1.08.  Mild anemia with hemoglobin 11.0, down from 11.4 previously.  History of sickle cell trait.  Feeling ok since last visit. Has been trying to get back on more regular eating schedule. Trying to have breakfast or a shake in the morning, eating lunch some days only. Dinner everyday. Fruit snacks at time during the day. No fever, bone pain, or night sweats.  Denies new concerns or recent illness.  At end of visit noted need for malaria prophylaxis. Will be traveling to [place] end of this month through mid-October. Will be leaving 7/30. Requests med for malaria prophylaxis. Mefloquine weekly worked well last year.   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   Severe dysplasia of cervix 01/11/2006   Fibroids 01/11/2006   S/P LEEP of cervix 01/11/2006   Pelvic pain 01/11/2006   H/O hysterectomy for benign disease 01/11/2006   Past Medical History:  Diagnosis Date   Allergy    seasonal - runny nose, congesiton, itchy eyes   Asthma    Asthma    [employer] 10/18/2020   Carpal tunnel syndrome, bilateral    s/p Rt surgery but recurrence   Fibroids    Follicle cyst    H/O measles    H/O mumps    History of chicken pox    Pelvic pain    Recurrent UTI (urinary tract infection)    Severe dysplasia of cervix    Sickle cell anemia (HCC)    sickle cell trait   Sickle cell trait (HCC)    Past Surgical History:  Procedure Laterality Date   ABDOMINAL HYSTERECTOMY  1998   CARPAL TUNNEL RELEASE Right 2001   CESAREAN SECTION     COLONOSCOPY     about 10 yrs ago   MYOMECTOMY     TUBAL LIGATION  Allergies  Allergen Reactions   Other    Sulfa Antibiotics    Prior to Admission medications   Medication Sig Start Date End Date Taking? Authorizing Provider  acetic acid-hydrocortisone (VOSOL-HC) OTIC solution Place 3 drops into both ears 3 (three) times daily. 05/21/21  Yes Janeece Agee, NP  Multiple Vitamin (MULTIVITAMIN) tablet Take 1 tablet by mouth daily.   Yes [provider]  Ascorbic Acid (VITAMIN C) 1000 MG tablet Take 1,000 mg by mouth daily. Patient not taking: Reported on 02/26/2023    [provider]  Cholecalciferol (VITAMIN D PO) Take 1 tablet by mouth as needed. Patient not taking: Reported on 02/26/2023    [provider]  mefloquine (LARIAM) 250 MG tablet Take 1 tablet (250 mg total) by mouth every 7 (seven) days. Until  finished. Patient not taking: Reported on 02/26/2023 02/19/22   Shade Flood, MD  typhoid (VIVOTIF) DR capsule Take 1 capsule by mouth every other day. Patient not taking: Reported on 02/26/2023 02/19/22   Shade Flood, MD   Social History   Socioeconomic History   Marital status: Married    Spouse name: Not on file   Number of children: Not on file   Years of education: Not on file   Highest education level: Bachelor's degree (e.g., BA, AB, BS)  Occupational History   Occupation: Runner, broadcasting/film/video    Comment: middle school  Tobacco Use   Smoking status: Never   Smokeless tobacco: Never  Vaping Use   Vaping status: Never Used  Substance and Sexual Activity   Alcohol use: No   Drug use: No   Sexual activity: Yes    Birth control/protection: Surgical, Post-menopausal  Other Topics Concern   Not on file  Social History Narrative   Not on file   Social Determinants of Health   Financial Resource Strain: Not on file  Food Insecurity: Not on file  Transportation Needs: Not on file  Physical Activity: Not on file  Stress: Not on file  Social Connections: Not on file  Intimate Partner Violence: Not on file    Review of Systems Per HPI.   Objective:   Vitals:   03/24/23 0929  BP: 128/80  Pulse: 73  Temp: 98.1 F (36.7 C)  TempSrc: Temporal  SpO2: 100%  Weight: 154 lb 6.4 oz (70 kg)  Height: 5\' 4"  (1.626 m)     Physical Exam Vitals reviewed.  Constitutional:      Appearance: Normal appearance. She is well-developed.  HENT:     Head: Normocephalic and atraumatic.  Eyes:     Conjunctiva/sclera: Conjunctivae normal.     Pupils: Pupils are equal, round, and reactive to light.  Neck:     Vascular: No carotid bruit.  Cardiovascular:     Rate and Rhythm: Normal rate and regular rhythm.     Heart sounds: Normal heart sounds.  Pulmonary:     Effort: Pulmonary effort is normal.     Breath sounds: Normal breath sounds.  Abdominal:     Palpations: Abdomen is soft.  There is no pulsatile mass.     Tenderness: There is no abdominal tenderness.  Musculoskeletal:     Right lower leg: No edema.     Left lower leg: No edema.  Skin:    General: Skin is warm and dry.  Neurological:     Mental Status: She is alert and oriented to person, place, and time.  Psychiatric:        Mood and Affect:  Mood normal.        Behavior: Behavior normal.        Assessment & Plan:  Kiarra Kidd is a 62 y.o. female . Weight loss - Plan: Sedimentation rate  Anemia, unspecified type - Plan: Sedimentation rate, CBC  Elevated serum creatinine - Plan: Basic metabolic panel  Need for malaria prophylaxis - Plan: mefloquine (LARIAM) 250 MG tablet  Elevated sed rate - Plan: Sedimentation rate  Elevated sed rate without other new symptoms.  Prior weight loss likely related to decreased intake and increased activity.  Has regained 1 pound, better with breakfast or shake in the morning.  Continued 3 meals per day with healthy snacks as needed, meal replacement shakes if needed.  Repeat sed rate, labs given borderline anemia, slightly lower than previous range as well as elevated creatinine from previous range.  Depending on results consider hematology eval.  Continue to monitor weight, recheck in 4 months with RTC precautions.  Malaria prophylaxis with mefloquine.  We did not review other travel recommendations as this was discussed at end of visit today.  Option of separate visit for travel recommendations or can meet with health department travel division.  Link given to [facility] recommendations for travel.    Meds ordered this encounter  Medications   mefloquine (LARIAM) 250 MG tablet    Sig: Take 1 tablet (250 mg total) by mouth every 7 (seven) days. Start 2 weeks prior to travel, continue through 4 weeks upon return.    Dispense:  16 tablet    Refill:  0   Patient Instructions  Continues to try to have breakfast each day, lunch each day or meal replacement shake if  needed such as Ensure or boost at the minimum.  Weight is back up 1 pound today.  I will recheck some of the lab work including elevated sed rate and if that is still high I may request that you see hematology.  I will let you know once I review the results.  Follow-up with me once you return from out of the country. Return to the clinic or go to the nearest emergency room if any of your symptoms worsen or new symptoms occur.  Start malaria prophylaxis 2 weeks prior to travel, continue weekly during travel and continue for 4 weeks upon return.  We did not have a chance to review other recommendations for travel today but I am happy to meet with you separately or you can consult the [facility] department travel department.  Here is a link to the CDC recommendations for travel.  Let me know if I can help further.  Take care.   [email or web address]     Signed,   Meredith Staggers, MD [facility], [facility] 03/24/23 10:15 AM

## 2023-03-25 LAB — CBC
HCT: 33.4 % — ABNORMAL LOW (ref 35.0–45.0)
Hemoglobin: 11 g/dL — ABNORMAL LOW (ref 11.7–15.5)
MCH: 29.5 pg (ref 27.0–33.0)
MCHC: 32.9 g/dL (ref 32.0–36.0)
MCV: 89.5 fL (ref 80.0–100.0)
MPV: 11.5 fL (ref 7.5–12.5)
Platelets: 228 10*3/uL (ref 140–400)
RBC: 3.73 10*6/uL — ABNORMAL LOW (ref 3.80–5.10)
RDW: 13.7 % (ref 11.0–15.0)
WBC: 5.9 10*3/uL (ref 3.8–10.8)

## 2023-03-25 LAB — SEDIMENTATION RATE: Sed Rate: 53 mm/h — ABNORMAL HIGH (ref 0–30)

## 2023-03-25 LAB — BASIC METABOLIC PANEL
BUN: 21 mg/dL (ref 7–25)
CO2: 27 mmol/L (ref 20–32)
Calcium: 9.1 mg/dL (ref 8.6–10.4)
Chloride: 109 mmol/L (ref 98–110)
Creat: 0.88 mg/dL (ref 0.50–1.05)
Glucose, Bld: 55 mg/dL — ABNORMAL LOW (ref 65–99)
Potassium: 3.7 mmol/L (ref 3.5–5.3)
Sodium: 145 mmol/L (ref 135–146)

## 2023-07-08 ENCOUNTER — Emergency Department (HOSPITAL_BASED_OUTPATIENT_CLINIC_OR_DEPARTMENT_OTHER): Payer: No Typology Code available for payment source

## 2023-07-08 ENCOUNTER — Emergency Department (HOSPITAL_BASED_OUTPATIENT_CLINIC_OR_DEPARTMENT_OTHER)
Admission: EM | Admit: 2023-07-08 | Discharge: 2023-07-08 | Disposition: A | Discharge status: Home / Self Care | Payer: No Typology Code available for payment source | Attending: Emergency Medicine | Admitting: Emergency Medicine

## 2023-07-08 ENCOUNTER — Other Ambulatory Visit: Payer: Self-pay

## 2023-07-08 ENCOUNTER — Encounter (HOSPITAL_BASED_OUTPATIENT_CLINIC_OR_DEPARTMENT_OTHER): Payer: Self-pay

## 2023-07-08 DIAGNOSIS — R519 Headache, unspecified: Secondary | ICD-10-CM | POA: Insufficient documentation

## 2023-07-08 DIAGNOSIS — S0993XA Unspecified injury of face, initial encounter: Secondary | ICD-10-CM | POA: Insufficient documentation

## 2023-07-08 DIAGNOSIS — W228XXA Striking against or struck by other objects, initial encounter: Secondary | ICD-10-CM | POA: Insufficient documentation
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
## 2023-07-08 NOTE — Discharge Instructions (Signed)
Please use Tylenol or ibuprofen for pain.  You may use 600 mg ibuprofen every 6 hours or 1000 mg of Tylenol every 6 hours.  You may choose to alternate between the 2.  This would be most effective.  Not to exceed 4 g of Tylenol within 24 hours.  Not to exceed 3200 mg ibuprofen 24 hours.  You can apply some ice directly to the site where you are having some facial pain.  You may want to rest for the rest of the day, you are okay to sleep, I do not see any evidence of a concussion.
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
## 2023-07-13 ENCOUNTER — Telehealth: Payer: Self-pay

## 2023-07-13 NOTE — Transitions of Care (Post Inpatient/ED Visit) (Signed)
   07/13/2023  Name: Jenna Cox MRN: [id number] DOB: 1961-05-08  Today's TOC FU Call Status: Today's TOC FU Call Status:: Successful TOC FU Call Completed TOC FU Call Complete Date: 07/13/23 Patient's Name and Date of Birth confirmed.  Transition Care Management Follow-up Telephone Call Date of Discharge: 07/08/23 Discharge Facility: [facility] ([facility] Type of Discharge: Emergency Department Reason for ED Visit: Other: (facial injury) How have you been since you were released from the hospital?: Better (a little better) Any questions or concerns?: No  Items Reviewed: Did you receive and understand the discharge instructions provided?: Yes Medications obtained,verified, and reconciled?: Yes (Medications Reviewed) Any new allergies since your discharge?: No Dietary orders reviewed?: No Do you have support at home?: Yes People in Home: child(ren), adult Name of Support/Comfort Primary Source: [place]  Medications Reviewed Today: Medications Reviewed Today   Medications were not reviewed in this encounter     Home Care and Equipment/Supplies: Were Home Health Services Ordered?: No Any new equipment or medical supplies ordered?: No  Functional Questionnaire: Do you need assistance with bathing/showering or dressing?: No Do you need assistance with meal preparation?: No Do you need assistance with eating?: No Do you have difficulty maintaining continence: No Do you need assistance with getting out of bed/getting out of a chair/moving?: No Do you have difficulty managing or taking your medications?: No  Follow up appointments reviewed: PCP Follow-up appointment confirmed?: Yes Date of PCP follow-up appointment?: 07/26/23 Follow-up Provider: Meredith Staggers, MD [facility] Follow-up appointment confirmed?: NA Do you need transportation to your follow-up appointment?: No Do you understand care options if your condition(s) worsen?: Yes-patient verbalized  understanding    SIGNATURE Hildred Laser, RMA

## 2023-07-26 ENCOUNTER — Encounter: Payer: Self-pay | Admitting: Family Medicine

## 2023-07-26 ENCOUNTER — Ambulatory Visit: Payer: No Typology Code available for payment source | Admitting: Family Medicine

## 2023-07-26 VITALS — BP 130/72 | HR 69 | Temp 97.7°F | Ht 64.0 in | Wt 157.2 lb

## 2023-07-26 DIAGNOSIS — S0083XD Contusion of other part of head, subsequent encounter: Secondary | ICD-10-CM

## 2023-07-26 NOTE — Progress Notes (Signed)
Subjective:  Patient ID: Jenna Cox, female    DOB: August 06, 1961  Age: 62 y.o. MRN: [id number]  CC:  Chief Complaint  Patient presents with   Hospitalization Follow-up    Pt notes doing better pain and swelling have resolved seems to be a bit of bruising remaining     HPI Jenna Cox presents for  Follow-up from ER.  ER notes reviewed from October 31.   Facial contusion Date of injury 10/31.  Per ER history parking arm came down on her face earlier that day.  Denied LOC, dizziness, nausea, vomiting or light sensitivity but she did have right-sided facial pain, brief eye blurring and some feeling of fullness in the right nose. Maxillofacial CT without facial fracture and clear sinuses.  Normal appearance of teeth in the right jaw.  No conjunctival redness and normal extraocular movements per ER note.  Was with husband - pushing his wheelchair, arm was opened - followed the [facility] Niece - walking behind with w/c. Gate arm came down and struck her R face. Saw stars, no loc. Had watery eye, bruising on side of face. Initial day - eye was blurry. Blurring resolved in a day or two. Bruise on frontal scalp - improved after 4-5 days. Soreness in frontal scalp resolved after 1 week. Some HA initial day - had to cancel appointments. No recent HA. Vision appears to be back to normal.  Has optho - plans to schedule for routine follow up.  Face feels ok now. Eating/drinking ok.       History Patient Active Problem List   Diagnosis Date Noted   Sickle cell trait (HCC) 11/09/2017   Anemia 11/09/2017   Colon polyps 10/24/2011   Carpal tunnel syndrome 11/20/2009   Urinary tract infection, recurrent 01/25/2006   Follicular cyst 01/25/2006   Severe dysplasia of cervix 01/11/2006   Fibroids 01/11/2006   S/P LEEP of cervix 01/11/2006   Pelvic pain 01/11/2006   H/O hysterectomy for benign disease 01/11/2006   Past Medical History:  Diagnosis Date   Allergy    seasonal - runny nose, congesiton,  itchy eyes   Asthma    Asthma    [employer] 10/18/2020   Carpal tunnel syndrome, bilateral    s/p Rt surgery but recurrence   Fibroids    Follicle cyst    H/O measles    H/O mumps    History of chicken pox    Pelvic pain    Recurrent UTI (urinary tract infection)    Severe dysplasia of cervix    Sickle cell anemia (HCC)    sickle cell trait   Sickle cell trait (HCC)    Past Surgical History:  Procedure Laterality Date   ABDOMINAL HYSTERECTOMY  1998   CARPAL TUNNEL RELEASE Right 2001   CESAREAN SECTION     COLONOSCOPY     about 10 yrs ago   MYOMECTOMY     TUBAL LIGATION     Allergies  Allergen Reactions   Other    Sulfa Antibiotics    Prior to Admission medications   Medication Sig Start Date End Date Taking? Authorizing Provider  acetic acid-hydrocortisone (VOSOL-HC) OTIC solution Place 3 drops into both ears 3 (three) times daily. 05/21/21  Yes Janeece Agee, NP  mefloquine (LARIAM) 250 MG tablet Take 1 tablet (250 mg total) by mouth every 7 (seven) days. Start 2 weeks prior to travel, continue through 4 weeks upon return. 03/24/23  Yes Shade Flood, MD  Multiple Vitamin (MULTIVITAMIN) tablet Take 1  tablet by mouth daily.   Yes [provider]  typhoid (VIVOTIF) DR capsule Take 1 capsule by mouth every other day. Patient not taking: Reported on 02/26/2023 02/19/22   Shade Flood, MD   Social History   Socioeconomic History   Marital status: Married    Spouse name: Not on file   Number of children: Not on file   Years of education: Not on file   Highest education level: Bachelor's degree (e.g., BA, AB, BS)  Occupational History   Occupation: Runner, broadcasting/film/video    Comment: middle school  Tobacco Use   Smoking status: Never   Smokeless tobacco: Never  Vaping Use   Vaping status: Never Used  Substance and Sexual Activity   Alcohol use: No   Drug use: No   Sexual activity: Yes    Birth control/protection: Surgical, Post-menopausal  Other Topics Concern    Not on file  Social History Narrative   Not on file   Social Determinants of Health   Financial Resource Strain: Not on file  Food Insecurity: Not on file  Transportation Needs: Not on file  Physical Activity: Not on file  Stress: Not on file  Social Connections: Not on file  Intimate Partner Violence: Not on file    Review of Systems  Per HPI Objective:   Vitals:   07/26/23 1143  BP: 130/72  Pulse: 69  Temp: 97.7 F (36.5 C)  TempSrc: Temporal  SpO2: 100%  Weight: 157 lb 3.2 oz (71.3 kg)  Height: 5\' 4"  (1.626 m)     Physical Exam Vitals reviewed.  Constitutional:      General: She is not in acute distress.    Appearance: Normal appearance. She is well-developed.  HENT:     Head: Normocephalic and atraumatic. No raccoon eyes, Battle's sign, abrasion, contusion, right periorbital erythema or laceration.     Jaw: There is normal jaw occlusion. No swelling, pain on movement or malocclusion.     Comments: Skin intact without erythema or ecchymosis.  No apparent soft tissue swelling.  No appreciable asymmetry or focal bony tenderness. Eyes:     General: Lids are normal.     Extraocular Movements: Extraocular movements intact.     Conjunctiva/sclera: Conjunctivae normal.     Comments: Anterior chamber clear bilaterally.  Cardiovascular:     Rate and Rhythm: Normal rate.  Pulmonary:     Effort: Pulmonary effort is normal.  Neurological:     Mental Status: She is alert and oriented to person, place, and time.  Psychiatric:        Mood and Affect: Mood normal.        Assessment & Plan:  Jenna Cox is a 62 y.o. female . Contusion of face, subsequent encounter Face contusion.  Symptoms have improved, brief headache resolved, no recurrence.  No ecchymosis or soft tissue swelling on exam at this time and no residual visual symptoms.  Exam is reassuring.  Deferred further testing at this time with RTC precautions given.  All questions were answered.  No  orders of the defined types were placed in this encounter.  Patient Instructions  Sorry to hear about the face contusion on October 31, but CT scan was reassuring and your exam is reassuring today.  I am also encouraged by the improvement in symptoms.  I do not recommend any other specific testing at this time unless you have any new or worsening symptoms.  Please let me know if I can help further and take care.  Facial or Scalp Contusion A facial or scalp contusion is a bruise (contusion) on the face or head. Contusions are the result of a direct force (blunttrauma) to the face or scalp that has caused bleeding under the skin. The contusion may turn blue, purple, or yellow (discoloration). Minor injuries may cause a painless contusion, but more severe contusions may stay painful and swollen for a few weeks. Injuries to the face and head generally cause a lot of swelling and discoloration, especially around the eyes. Contusions by themselves are generally not life threatening. You may have a contusion along with other injuries, such as broken bones (fractures), cuts, and injuries to blood vessels. What are the causes? A facial or scalp contusion is caused by a injury, fall, or trauma to the face or head area. Contusions may result from: Motor vehicle accidents. Sports injuries. Assault injuries. What are the signs or symptoms? Symptoms of this condition include: Swelling of the injured area. The swelling may be in a small area (localized) and very noticeable. Discoloration of the injured area. Tenderness, soreness, or pain in the injured area. In addition to symptoms of contusions, if you have facial fractures, you may have a change in the appearance of your nose, may not be able to close your mouth, and may have vision changes. How is this diagnosed? This condition is diagnosed based on your medical history and a physical exam. An X-ray exam, CT scan, or MRI may be needed to check for any  additional injuries. In some cases, your health care provider may need to do more examinations of your nose, eyes, or jaw. How is this treated? Often, the best treatment for a facial or scalp contusion is applying cold compresses to the injured area. Over-the-counter medicines may also be recommended to help relieve pain. If there are any cuts (lacerations), these will need to be repaired as well. Any deeper injuries may require treatment and follow up with a specialist, such as a facial surgeon or an eye specialist (ophthalmologist). Follow these instructions at home: Managing pain, stiffness, and swelling  If directed, put ice on the injured area. To do this: Put ice in a plastic bag. Place a towel between your skin and the bag. Leave the ice on for 20 minutes, 2-3 times a day. Remove the ice if your skin turns bright red. This is very important. If you cannot feel pain, heat, or cold, you have a greater risk of damage to the area. Raise (elevate) the injured area above the level of your heart while you are sitting or lying down. General instructions Take over-the-counter and prescription medicines only as told by your health care provider. Rest as told by your health care provider. Return to your normal activities as told by your health care provider. Ask your health care provider what activities are safe for you. Do not blow your nose if you have any facial fractures. Eat soft foods if you are having jaw pain. Keep all follow-up visits. This is important. Contact a health care provider if: You have trouble biting or chewing. Your pain or swelling gets worse. The discolored area gets much worse. Get help right away if: You have severe pain or a headache that is not relieved by medicine. You have unusual sleepiness, confusion, or personality changes. You vomit. You have a nosebleed that does not stop. You have double vision or blurred vision. You have clear fluid draining from your  nose or ear, and it does not go away. You  have trouble walking or using your arms or legs. You have severe dizziness. Summary A facial or scalp contusion is a bruise (contusion) on the face or head. Contusions are the result of an injury that caused bleeding and swelling under the skin. Minor contusions will have mild symptoms, but more severe contusions may stay painful and swollen for a few weeks. Some facial and head contusions may be associated with other more serious injuries. Go to a health care provider if you have vision changes, bleeding from your face or nose, or you are not able to to bite down normally. Often, the best treatment for a facial or scalp contusion is applying cold compresses to the injured area. This information is not intended to replace advice given to you by your health care provider. Make sure you discuss any questions you have with your health care provider. Document Revised: 09/30/2020 Document Reviewed: 09/30/2020 Elsevier Patient Education  2024 [employer] Inc.     Signed,   Meredith Staggers, MD [facility], [facility] 07/26/23 12:29 PM

## 2023-07-26 NOTE — Patient Instructions (Signed)
Sorry to hear about the face contusion on October 31, but CT scan was reassuring and your exam is reassuring today.  I am also encouraged by the improvement in symptoms.  I do not recommend any other specific testing at this time unless you have any new or worsening symptoms.  Please let me know if I can help further and take care.  Facial or Scalp Contusion A facial or scalp contusion is a bruise (contusion) on the face or head. Contusions are the result of a direct force (blunttrauma) to the face or scalp that has caused bleeding under the skin. The contusion may turn blue, purple, or yellow (discoloration). Minor injuries may cause a painless contusion, but more severe contusions may stay painful and swollen for a few weeks. Injuries to the face and head generally cause a lot of swelling and discoloration, especially around the eyes. Contusions by themselves are generally not life threatening. You may have a contusion along with other injuries, such as broken bones (fractures), cuts, and injuries to blood vessels. What are the causes? A facial or scalp contusion is caused by a injury, fall, or trauma to the face or head area. Contusions may result from: Motor vehicle accidents. Sports injuries. Assault injuries. What are the signs or symptoms? Symptoms of this condition include: Swelling of the injured area. The swelling may be in a small area (localized) and very noticeable. Discoloration of the injured area. Tenderness, soreness, or pain in the injured area. In addition to symptoms of contusions, if you have facial fractures, you may have a change in the appearance of your nose, may not be able to close your mouth, and may have vision changes. How is this diagnosed? This condition is diagnosed based on your medical history and a physical exam. An X-ray exam, CT scan, or MRI may be needed to check for any additional injuries. In some cases, your health care provider may need to do more  examinations of your nose, eyes, or jaw. How is this treated? Often, the best treatment for a facial or scalp contusion is applying cold compresses to the injured area. Over-the-counter medicines may also be recommended to help relieve pain. If there are any cuts (lacerations), these will need to be repaired as well. Any deeper injuries may require treatment and follow up with a specialist, such as a facial surgeon or an eye specialist (ophthalmologist). Follow these instructions at home: Managing pain, stiffness, and swelling  If directed, put ice on the injured area. To do this: Put ice in a plastic bag. Place a towel between your skin and the bag. Leave the ice on for 20 minutes, 2-3 times a day. Remove the ice if your skin turns bright red. This is very important. If you cannot feel pain, heat, or cold, you have a greater risk of damage to the area. Raise (elevate) the injured area above the level of your heart while you are sitting or lying down. General instructions Take over-the-counter and prescription medicines only as told by your health care provider. Rest as told by your health care provider. Return to your normal activities as told by your health care provider. Ask your health care provider what activities are safe for you. Do not blow your nose if you have any facial fractures. Eat soft foods if you are having jaw pain. Keep all follow-up visits. This is important. Contact a health care provider if: You have trouble biting or chewing. Your pain or swelling gets worse. The discolored  area gets much worse. Get help right away if: You have severe pain or a headache that is not relieved by medicine. You have unusual sleepiness, confusion, or personality changes. You vomit. You have a nosebleed that does not stop. You have double vision or blurred vision. You have clear fluid draining from your nose or ear, and it does not go away. You have trouble walking or using your arms  or legs. You have severe dizziness. Summary A facial or scalp contusion is a bruise (contusion) on the face or head. Contusions are the result of an injury that caused bleeding and swelling under the skin. Minor contusions will have mild symptoms, but more severe contusions may stay painful and swollen for a few weeks. Some facial and head contusions may be associated with other more serious injuries. Go to a health care provider if you have vision changes, bleeding from your face or nose, or you are not able to to bite down normally. Often, the best treatment for a facial or scalp contusion is applying cold compresses to the injured area. This information is not intended to replace advice given to you by your health care provider. Make sure you discuss any questions you have with your health care provider. Document Revised: 09/30/2020 Document Reviewed: 09/30/2020 Elsevier Patient Education  2024 [employer].

## 2023-07-28 ENCOUNTER — Ambulatory Visit (INDEPENDENT_AMBULATORY_CARE_PROVIDER_SITE_OTHER): Payer: No Typology Code available for payment source

## 2023-07-28 ENCOUNTER — Ambulatory Visit (INDEPENDENT_AMBULATORY_CARE_PROVIDER_SITE_OTHER): Payer: No Typology Code available for payment source | Admitting: Podiatry

## 2023-07-28 ENCOUNTER — Encounter: Payer: Self-pay | Admitting: Podiatry

## 2023-07-28 DIAGNOSIS — M779 Enthesopathy, unspecified: Secondary | ICD-10-CM

## 2023-07-28 DIAGNOSIS — M2041 Other hammer toe(s) (acquired), right foot: Secondary | ICD-10-CM

## 2023-07-28 DIAGNOSIS — M21619 Bunion of unspecified foot: Secondary | ICD-10-CM

## 2023-07-28 DIAGNOSIS — B070 Plantar wart: Secondary | ICD-10-CM | POA: Diagnosis not present

## 2023-07-28 NOTE — Progress Notes (Signed)
Subjective:   Patient ID: Jenna Cox, female   DOB: 62 y.o.   MRN: [id number]   HPI Patient presents stating both my feet hurt but the right bunion is really bothering me and the fifth toe.  Patient also points plantar aspect left states it has been very sore and she tries to work on it herself.  Patient does not smoke likes to be active   Review of Systems  All other systems reviewed and are negative.       Objective:  Physical Exam Vitals and nursing note reviewed.  Constitutional:      Appearance: She is well-developed.  Pulmonary:     Effort: Pulmonary effort is normal.  Musculoskeletal:        General: Normal range of motion.  Skin:    General: Skin is warm.  Neurological:     Mental Status: She is alert.     Neurovascular status was found to be intact muscle strength found to be adequate range of motion adequate with patient noted to have large hyperostosis medial aspect first metatarsal head right deviation hallux against the second toe keratotic lesion digit 5 right with history of surgery by me 5 years ago left foot is doing very well.  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

## 2023-09-02 NOTE — Telephone Encounter (Signed)
error 

## 2024-01-27 ENCOUNTER — Telehealth: Payer: Self-pay | Admitting: Family Medicine

## 2024-01-27 NOTE — Telephone Encounter (Signed)
 Form was sent from [facility] for current pt needs signatures and sent back to its designated place. Paper work is placed in Chart[employer] loss adjuster.   Please advise,  Thanks

## 2024-01-28 NOTE — Telephone Encounter (Signed)
 Paperwork completed and placed in fax bin at back nurse station

## 2024-01-28 NOTE — Telephone Encounter (Signed)
 Faxed back to requested number

## 2024-01-28 NOTE — Telephone Encounter (Signed)
Placed in your sign folder at the nurse station

## 2024-03-16 ENCOUNTER — Encounter: Attending: Internal Medicine | Admitting: Dietician

## 2024-03-16 ENCOUNTER — Encounter: Payer: Self-pay | Admitting: Dietician

## 2024-03-16 VITALS — Wt 158.8 lb

## 2024-03-16 DIAGNOSIS — E663 Overweight: Secondary | ICD-10-CM | POA: Insufficient documentation

## 2024-03-16 NOTE — Progress Notes (Signed)
 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   Pt reports she has been trying to incorporate more exercise. Pt states she enjoys walking outdoors but has been going less since its gotten hotter. Pt states she just got silver sneakers and [facility]   Pt reports sometimes she doesn't have time to eat and then she feels she eats a lot right before bed. Pt reports typically going to bed around 1am and waking around 9am.   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   Estimated daily fluid intake: 48+ oz Supplements: MVI Sleep: 6-8 hours Stress / self-care: moderate to high stress Current average weekly physical activity: walking every other day outside or treadmill at apartment gym   24-Hr Dietary Recall First Meal: 9am: omelet and pancake OR none Snack: none Second Meal: 12-3pm: pasta alfredo OR rice and sardines and ocra OR salad with veggies and egg Snack: nuts OR fruit OR plantain chips Third Meal: 9pm: bowl of cereal OR rice and  sardines and ocra Snack: fruit and yogurt Beverages: tea, 48 oz water, 2 cans sparkling water, splash of juice   NUTRITION DIAGNOSIS  NB-1.1 Food and nutrition-related knowledge deficit As related to lack of prior education by a registered dietitian.  As evidenced by pt report.   NUTRITION INTERVENTION  Nutrition education (E-1) on the following topics:   LDL-Lowering Nutrition Therapy Soluble fiber and impact on lowering cholesterol: Sources: Oats, barley, beans, lentils, fruits (apples, oranges), vegetables (carrots, broccoli), and flaxseeds. Benefits: Soluble fiber reduces the absorption of cholesterol into your bloodstream. Choose Healthy Unsaturated Fats and Omega 3's. Sources: Olive oil, canola oil, avocados, nuts, and Fatty fish (salmon, trout), walnuts, flaxseeds, and chia seeds. Benefits: Helps raise HDL cholesterol and lower LDL cholesterol. Omega-3s help reduce triglycerides and raise HDL cholesterol. Limit Saturated Fats: Sources: Red meat, full-fat dairy products, butter, and coconut oil. Benefits: Reducing intake helps lower LDL cholesterol levels. Eat Plenty of Fruits and Vegetables. Benefits: High in fiber and antioxidants, which help lower LDL cholesterol. Eat Whole Grains. Sources: Brown rice, whole wheat bread, quinoa, and whole grain pasta. Benefits: Whole grains contain more fiber and nutrients than refined grains. Regular Physical Activity: Aim for at least 30 minutes of moderate-intensity exercise most days of the week.  Plate Method/Food Groups Fruits & Vegetables: Aim to fill half your plate with a variety of fruits and vegetables. They are rich in vitamins, minerals, and fiber, and can help reduce the risk of chronic diseases. Choose a colorful assortment of fruits and vegetables to  ensure you get a wide range of nutrients. Grains and Starches: Make at least half of your grain choices whole grains, such as brown rice, whole wheat bread, and oats. Whole grains provide  fiber, which aids in digestion and healthy cholesterol levels. Aim for whole forms of starchy vegetables such as potatoes, sweet potatoes, beans, peas, and corn, which are fiber rich and provide many vitamins and minerals.  Protein: Incorporate lean sources of protein, such as poultry, fish, beans, nuts, and seeds, into your meals. Protein is essential for building and repairing tissues, staying full, balancing blood sugar, as well as supporting immune function. Dairy: Include low-fat or fat-free dairy products like milk, yogurt, and cheese in your diet. Dairy foods are excellent sources of calcium and vitamin D , which are crucial for bone health.   Handouts Provided Include  Plate Method  Learning Style & Readiness for Change Teaching method utilized: Visual & Auditory  Demonstrated degree of understanding via: Teach Back  Barriers to learning/adherence to lifestyle change: none  Goals Established by Pt  Goal [facility] and [facility] 1-2 times per week.   Goal 2: meal prep on Saturday or Sunday for the week.   Goal 3: make 1 meal per day follow the Plate Method (1/2 plate non-starchy vegetables, 1/4 plate protein, and 1/4 plate complex carbs).    MONITORING & EVALUATION Dietary intake, weekly physical activity, and follow up in 2 months.  Next Steps  Patient is to call for questions.

## 2024-03-16 NOTE — Patient Instructions (Signed)
 Goals Established by Pt  Goal 1: [facility] for [facility] and do [facility]ics 1-2 times per week.   Goal 2: meal prep on Saturday or Sunday for the week.   Goal 3: make 1 meal per day follow the Plate Method (1/2 plate non-starchy vegetables, 1/4 plate protein, and 1/4 plate complex carbs).

## 2024-05-30 ENCOUNTER — Ambulatory Visit: Admitting: Dietician

## 2024-05-31 ENCOUNTER — Encounter: Attending: Internal Medicine | Admitting: Dietician

## 2024-05-31 ENCOUNTER — Encounter: Payer: Self-pay | Admitting: Dietician

## 2024-05-31 VITALS — Wt 160.0 lb

## 2024-05-31 DIAGNOSIS — E663 Overweight: Secondary | ICD-10-CM | POA: Insufficient documentation

## 2024-05-31 NOTE — Patient Instructions (Signed)
 New Goals:  Goal 1: eat dinner at 6pm at least 3-4 days a week.   Goal 2: have your smoothie for breakfast at least 3 days a week.

## 2024-05-31 NOTE — Progress Notes (Signed)
 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   Pt reports she has been working on sleep, and focusing on getting 8 hours.   Pt reports she fell off a little from her exercise due to traveling, but has been walking every other day. Pt reports [facility] [employer] but can't start [facility]ics until next month.   Pt reports she is having breakfast 1 day per week.   Pt states she plans to travel to [place] and [place] in the next few months.   Estimated daily fluid intake: 48+ oz Supplements: MVI Sleep: 6-8 hours Stress / self-care: moderate to high stress Current average weekly physical activity: walking every other day outside or treadmill at apartment gym, plans [facility] [employer] [facility].   24-Hr Dietary Recall First Meal: none  Snack: none Second Meal: 1pm: 2 'veggies made great' muffins OR burrito bowl Snack: fruit OR yogurt Third Meal: 10pm: rice and sardines OR chicken veggie stir fry and orzo pasta Snack: fruit and yogurt Beverages: tea, 48 oz water, 2 cans sparkling water, splash of juice   NUTRITION DIAGNOSIS  NB-1.1 Food and  nutrition-related knowledge deficit As related to lack of prior education by a registered dietitian.  As evidenced by pt report.   NUTRITION INTERVENTION  Nutrition education (E-1) on the following topics:   Body Acceptance Accepting your body as it is today is a crucial first step before starting any weight loss journey. Embracing body acceptance fosters kindness and patience, helping you build a healthier, more sustainable relationship with food and yourself. Instead of focusing solely on the scale, tune into how your body feels and prioritize nourishing it with balanced meals that support your energy and wellbeing. Set goals that celebrate strength, stamina, and overall health rather than just weight. Remember, health is more than a number--it includes mental, emotional, and physical wellness. Treat yourself with compassion and celebrate every step of progress along the way.   LDL-Lowering Nutrition Therapy Soluble fiber and impact on lowering cholesterol: Sources: Oats, barley, beans, lentils, fruits (apples, oranges), vegetables (carrots, broccoli), and flaxseeds. Benefits: Soluble fiber reduces the absorption of cholesterol into your bloodstream. Choose Healthy Unsaturated Fats and Omega 3's. Sources: Olive oil, canola oil, avocados, nuts, and Fatty fish (salmon, trout), walnuts, flaxseeds, and chia seeds. Benefits: Helps raise HDL cholesterol and lower LDL cholesterol. Omega-3s help reduce triglycerides and raise HDL cholesterol. Limit Saturated Fats: Sources: Red meat, full-fat dairy products, butter, and coconut oil. Benefits: Reducing intake helps lower LDL cholesterol levels. Eat Plenty of Fruits and Vegetables. Benefits: High in fiber and antioxidants, which help lower LDL cholesterol. Eat Whole Grains. Sources: Brown rice, whole wheat bread, quinoa, and  whole grain pasta. Benefits: Whole grains contain more fiber and nutrients than refined grains. Regular Physical Activity: Aim for at  least 30 minutes of moderate-intensity exercise most days of the week.  Plate Method/Food Groups Fruits & Vegetables: Aim to fill half your plate with a variety of fruits and vegetables. They are rich in vitamins, minerals, and fiber, and can help reduce the risk of chronic diseases. Choose a colorful assortment of fruits and vegetables to ensure you get a wide range of nutrients. Grains and Starches: Make at least half of your grain choices whole grains, such as brown rice, whole wheat bread, and oats. Whole grains provide fiber, which aids in digestion and healthy cholesterol levels. Aim for whole forms of starchy vegetables such as potatoes, sweet potatoes, beans, peas, and corn, which are fiber rich and provide many vitamins and minerals.  Protein: Incorporate lean sources of protein, such as poultry, fish, beans, nuts, and seeds, into your meals. Protein is essential for building and repairing tissues, staying full, balancing blood sugar, as well as supporting immune function. Dairy: Include low-fat or fat-free dairy products like milk, yogurt, and cheese in your diet. Dairy foods are excellent sources of calcium and vitamin D , which are crucial for bone health.   Handouts Provided Include (initial assessment) Plate Method  Learning Style & Readiness for Change Teaching method utilized: Visual & Auditory  Demonstrated degree of understanding via: Teach Back  Barriers to learning/adherence to lifestyle change: none  Assessment of Previous Goals Established by Pt  Goal 1: [facility] [employer] and do [facility] 1-2 times per week. - signed up, hasn't gone. Signing up next month.   Goal 2: meal prep on Saturday or Sunday for the week. - goal not met, continue.   Goal 3: make 1 meal per day follow the Plate Method (1/2 plate non-starchy vegetables, 1/4 plate protein, and 1/4 plate complex carbs). - goal in progress, continue.   New Goals:  Goal 1: eat dinner at 6pm at least 3-4 days a week.    Goal 2: have your smoothie for breakfast at least 3 days a week.   MONITORING & EVALUATION Dietary intake, weekly physical activity, and follow up in 2-3 months.   Next Steps  Patient is to call for questions.

## 2024-08-24 ENCOUNTER — Ambulatory Visit: Admitting: Dietician

## 2024-09-20 ENCOUNTER — Encounter: Admitting: Dietician

## 2024-09-21 ENCOUNTER — Encounter: Attending: Internal Medicine | Admitting: Dietician

## 2024-09-21 ENCOUNTER — Encounter: Payer: Self-pay | Admitting: Dietician

## 2024-09-21 VITALS — Wt 160.8 lb

## 2024-09-21 DIAGNOSIS — E663 Overweight: Secondary | ICD-10-CM | POA: Insufficient documentation

## 2024-09-21 NOTE — Progress Notes (Signed)
 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 Pt states she has not been eating until 2-4pm, and typically wakes up around 8/9am. Pt reports she feels she has been making the wrong food choices. Pt reports she wants to start focusing on the Mediterranean diet.   Pt states she has been inconsistent with exercise.   Pt reports she is going to be traveling to [place] for work, and finds that when she is traveling her eating and exercise is more consistent, and hopes to get on a better schedule.    Estimated daily fluid intake: 48+ oz Supplements: MVI Sleep: 6-8 hours Stress / self-care: moderate to high stress Current average weekly physical activity: ADLs, exercise more infrequent  24-Hr Dietary Recall First Meal: none  Snack: none Second Meal: 3pm: 2 pieces [place]  toast Snack: grapes Third Meal: 6pm: rice and greens Snack: none Beverages: tea with honey, 48 oz water, 2 cans sparkling water   NUTRITION DIAGNOSIS  NB-1.1 Food and nutrition-related knowledge deficit As related to lack of prior education by a registered dietitian.  As evidenced by pt report.   NUTRITION INTERVENTION  Nutrition education (E-1) on the following topics:   Body Acceptance Accepting your body as it is today is a crucial first step  before starting any weight loss journey. Embracing body acceptance fosters kindness and patience, helping you build a healthier, more sustainable relationship with food and yourself. Instead of focusing solely on the scale, tune into how your body feels and prioritize nourishing it with balanced meals that support your energy and wellbeing. Set goals that celebrate strength, stamina, and overall health rather than just weight. Remember, health is more than a number--it includes mental, emotional, and physical wellness. Treat yourself with compassion and celebrate every step of progress along the way.   LDL-Lowering Nutrition Therapy Soluble fiber and impact on lowering cholesterol: Sources: Oats, barley, beans, lentils, fruits (apples, oranges), vegetables (carrots, broccoli), and flaxseeds. Benefits: Soluble fiber reduces the absorption of cholesterol into your bloodstream. Choose Healthy Unsaturated Fats and Omega 3's. Sources: Olive oil, canola oil, avocados, nuts, and Fatty fish (salmon, trout), walnuts, flaxseeds, and chia seeds. Benefits: Helps raise HDL cholesterol and lower LDL cholesterol. Omega-3s help reduce triglycerides and raise HDL cholesterol. Limit Saturated Fats: Sources: Red meat, full-fat dairy products, butter, and coconut oil. Benefits: Reducing intake helps lower LDL cholesterol levels. Eat Plenty of Fruits and Vegetables. Benefits: High in fiber and antioxidants, which help lower LDL cholesterol. Eat Whole Grains. Sources: Brown rice, whole wheat bread, quinoa, and whole grain pasta. Benefits: Whole grains contain more fiber and nutrients than refined grains. Regular Physical Activity: Aim for at least 30 minutes of moderate-intensity exercise most days of the week.  Plate Method/Food Groups Fruits & Vegetables: Aim to fill half your plate with a variety of fruits and vegetables. They are rich in vitamins, minerals, and fiber, and can  help reduce the risk of chronic diseases.  Choose a colorful assortment of fruits and vegetables to ensure you get a wide range of nutrients. Grains and Starches: Make at least half of your grain choices whole grains, such as brown rice, whole wheat bread, and oats. Whole grains provide fiber, which aids in digestion and healthy cholesterol levels. Aim for whole forms of starchy vegetables such as potatoes, sweet potatoes, beans, peas, and corn, which are fiber rich and provide many vitamins and minerals.  Protein: Incorporate lean sources of protein, such as poultry, fish, beans, nuts, and seeds, into your meals. Protein is essential for building and repairing tissues, staying full, balancing blood sugar, as well as supporting immune function. Dairy: Include low-fat or fat-free dairy products like milk, yogurt, and cheese in your diet. Dairy foods are excellent sources of calcium and vitamin D , which are crucial for bone health.   Handouts Provided Include (initial assessment) Plate Method  Learning Style & Readiness for Change Teaching method utilized: Visual & Auditory  Demonstrated degree of understanding via: Teach Back  Barriers to learning/adherence to lifestyle change: none  Assessment of Previous Goals Established by Pt  Goal 1: [facility] for [employer] and do [facility]ics 1-2 times per week. - goal not met, has been inconsistent  Goal 2: meal prep on Saturday or Sunday for the week. - goal not met, continue.   Goal 3: make 1 meal per day follow the Plate Method (1/2 plate non-starchy vegetables, 1/4 plate protein, and 1/4 plate complex carbs). - goal in progress, continue.   Goal 4: eat dinner at 6pm at least 3-4 days a week. - goal met, continue!  Goal 5: have your smoothie for breakfast at least 3 days a week. - goal not met, changed goal to simply eat breakfast 3 days a week.   MONITORING & EVALUATION Dietary intake, weekly physical activity, and follow up in 2-3 months.   Next Steps  Patient is to call for questions.

## 2024-11-30 ENCOUNTER — Encounter: Admitting: Dietician
